# Patient Record
Sex: Female | Born: 1985 | Race: Black or African American | Hispanic: No | Marital: Single | Smoking: Current every day smoker
Health system: Southern US, Community
[De-identification: ages and names within clinical notes are randomized; demographics above are authoritative.]

## PROBLEM LIST (undated history)

## (undated) DIAGNOSIS — K219 Gastro-esophageal reflux disease without esophagitis: Secondary | ICD-10-CM

## (undated) DIAGNOSIS — F419 Anxiety disorder, unspecified: Secondary | ICD-10-CM

## (undated) DIAGNOSIS — G43909 Migraine, unspecified, not intractable, without status migrainosus: Secondary | ICD-10-CM

## (undated) DIAGNOSIS — F32A Depression, unspecified: Secondary | ICD-10-CM

## (undated) DIAGNOSIS — R011 Cardiac murmur, unspecified: Secondary | ICD-10-CM

## (undated) HISTORY — PX: MULTIPLE TOOTH EXTRACTIONS: SHX2053

## (undated) HISTORY — PX: DILATION AND CURETTAGE OF UTERUS: SHX78

---
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
## 2004-10-18 ENCOUNTER — Inpatient Hospital Stay (HOSPITAL_COMMUNITY): Admission: AD | Admit: 2004-10-18 | Discharge: 2004-10-21 | Payer: Self-pay | Admitting: Obstetrics and Gynecology

## 2004-11-19 ENCOUNTER — Other Ambulatory Visit: Admission: RE | Admit: 2004-11-19 | Discharge: 2004-11-19 | Payer: Self-pay | Admitting: Obstetrics and Gynecology

## 2006-07-15 ENCOUNTER — Encounter (INDEPENDENT_AMBULATORY_CARE_PROVIDER_SITE_OTHER): Payer: Self-pay | Admitting: *Deleted

## 2006-07-15 ENCOUNTER — Other Ambulatory Visit: Admission: RE | Admit: 2006-07-15 | Discharge: 2006-07-15 | Payer: Self-pay | Admitting: Unknown Physician Specialty

## 2009-05-11 ENCOUNTER — Emergency Department (HOSPITAL_COMMUNITY): Admission: EM | Admit: 2009-05-11 | Discharge: 2009-05-11 | Payer: Self-pay | Admitting: Emergency Medicine

## 2010-03-15 ENCOUNTER — Emergency Department (HOSPITAL_COMMUNITY): Admission: EM | Admit: 2010-03-15 | Discharge: 2010-03-15 | Payer: Self-pay | Admitting: Emergency Medicine

## 2010-04-07 ENCOUNTER — Emergency Department (HOSPITAL_COMMUNITY): Admission: EM | Admit: 2010-04-07 | Discharge: 2010-04-07 | Payer: Self-pay | Admitting: Emergency Medicine

## 2010-04-15 ENCOUNTER — Emergency Department (HOSPITAL_COMMUNITY): Admission: EM | Admit: 2010-04-15 | Discharge: 2010-04-16 | Payer: Self-pay | Admitting: Emergency Medicine

## 2010-06-29 ENCOUNTER — Emergency Department (HOSPITAL_BASED_OUTPATIENT_CLINIC_OR_DEPARTMENT_OTHER): Admission: EM | Admit: 2010-06-29 | Discharge: 2010-06-29 | Payer: Self-pay | Admitting: Emergency Medicine

## 2010-07-01 ENCOUNTER — Emergency Department (HOSPITAL_BASED_OUTPATIENT_CLINIC_OR_DEPARTMENT_OTHER): Admission: EM | Admit: 2010-07-01 | Discharge: 2010-07-01 | Payer: Self-pay | Admitting: Emergency Medicine

## 2010-07-05 ENCOUNTER — Emergency Department (HOSPITAL_BASED_OUTPATIENT_CLINIC_OR_DEPARTMENT_OTHER): Admission: EM | Admit: 2010-07-05 | Discharge: 2010-07-05 | Payer: Self-pay | Admitting: Emergency Medicine

## 2010-07-12 ENCOUNTER — Emergency Department (HOSPITAL_BASED_OUTPATIENT_CLINIC_OR_DEPARTMENT_OTHER): Admission: EM | Admit: 2010-07-12 | Discharge: 2010-07-12 | Payer: Self-pay | Admitting: Emergency Medicine

## 2010-11-23 LAB — URINALYSIS, ROUTINE W REFLEX MICROSCOPIC
Glucose, UA: NEGATIVE mg/dL
Leukocytes, UA: NEGATIVE
Nitrite: NEGATIVE
Protein, ur: NEGATIVE mg/dL
Urobilinogen, UA: 0.2 mg/dL (ref 0.0–1.0)

## 2010-11-23 LAB — URINE MICROSCOPIC-ADD ON

## 2010-11-23 LAB — PREGNANCY, URINE: Preg Test, Ur: NEGATIVE

## 2011-02-13 LAB — RPR: RPR: NONREACTIVE

## 2011-02-13 LAB — HEPATITIS B SURFACE ANTIGEN: Hepatitis B Surface Ag: NEGATIVE

## 2011-02-13 LAB — ABO/RH: RH Type: POSITIVE

## 2011-02-13 LAB — RUBELLA ANTIBODY, IGM: Rubella: IMMUNE

## 2011-02-14 ENCOUNTER — Other Ambulatory Visit (HOSPITAL_COMMUNITY): Payer: Self-pay | Admitting: Obstetrics and Gynecology

## 2011-02-14 DIAGNOSIS — Z3682 Encounter for antenatal screening for nuchal translucency: Secondary | ICD-10-CM

## 2011-02-20 ENCOUNTER — Ambulatory Visit (HOSPITAL_COMMUNITY): Payer: Self-pay

## 2011-02-21 ENCOUNTER — Ambulatory Visit (HOSPITAL_COMMUNITY)
Admission: RE | Admit: 2011-02-21 | Discharge: 2011-02-21 | Disposition: A | Discharge status: Home / Self Care | Payer: Medicaid Other | Source: Ambulatory Visit | Attending: Obstetrics and Gynecology | Admitting: Obstetrics and Gynecology

## 2011-02-21 ENCOUNTER — Other Ambulatory Visit (HOSPITAL_COMMUNITY): Payer: Self-pay | Admitting: Obstetrics and Gynecology

## 2011-02-21 DIAGNOSIS — E669 Obesity, unspecified: Secondary | ICD-10-CM | POA: Insufficient documentation

## 2011-02-21 DIAGNOSIS — O351XX0 Maternal care for (suspected) chromosomal abnormality in fetus, not applicable or unspecified: Secondary | ICD-10-CM | POA: Insufficient documentation

## 2011-02-21 DIAGNOSIS — Z09 Encounter for follow-up examination after completed treatment for conditions other than malignant neoplasm: Secondary | ICD-10-CM

## 2011-02-21 DIAGNOSIS — Z3689 Encounter for other specified antenatal screening: Secondary | ICD-10-CM | POA: Insufficient documentation

## 2011-02-21 DIAGNOSIS — Z3682 Encounter for antenatal screening for nuchal translucency: Secondary | ICD-10-CM

## 2011-02-21 DIAGNOSIS — O3510X0 Maternal care for (suspected) chromosomal abnormality in fetus, unspecified, not applicable or unspecified: Secondary | ICD-10-CM | POA: Insufficient documentation

## 2011-02-21 DIAGNOSIS — O99210 Obesity complicating pregnancy, unspecified trimester: Secondary | ICD-10-CM | POA: Insufficient documentation

## 2011-03-12 ENCOUNTER — Ambulatory Visit (HOSPITAL_COMMUNITY)
Admission: RE | Admit: 2011-03-12 | Discharge: 2011-03-12 | Disposition: A | Discharge status: Home / Self Care | Payer: Medicaid Other | Source: Ambulatory Visit | Attending: Obstetrics and Gynecology | Admitting: Obstetrics and Gynecology

## 2011-03-12 DIAGNOSIS — O351XX0 Maternal care for (suspected) chromosomal abnormality in fetus, not applicable or unspecified: Secondary | ICD-10-CM | POA: Insufficient documentation

## 2011-03-12 DIAGNOSIS — O3510X0 Maternal care for (suspected) chromosomal abnormality in fetus, unspecified, not applicable or unspecified: Secondary | ICD-10-CM | POA: Insufficient documentation

## 2011-04-04 ENCOUNTER — Other Ambulatory Visit (HOSPITAL_COMMUNITY): Payer: Self-pay | Admitting: Obstetrics and Gynecology

## 2011-04-04 ENCOUNTER — Ambulatory Visit (HOSPITAL_COMMUNITY)
Admission: RE | Admit: 2011-04-04 | Discharge: 2011-04-04 | Disposition: A | Discharge status: Home / Self Care | Payer: Medicaid Other | Source: Ambulatory Visit | Attending: Obstetrics and Gynecology | Admitting: Obstetrics and Gynecology

## 2011-04-04 ENCOUNTER — Encounter (HOSPITAL_COMMUNITY): Payer: Self-pay

## 2011-04-04 DIAGNOSIS — Z09 Encounter for follow-up examination after completed treatment for conditions other than malignant neoplasm: Secondary | ICD-10-CM

## 2011-04-04 DIAGNOSIS — E669 Obesity, unspecified: Secondary | ICD-10-CM | POA: Insufficient documentation

## 2011-04-04 DIAGNOSIS — O358XX0 Maternal care for other (suspected) fetal abnormality and damage, not applicable or unspecified: Secondary | ICD-10-CM | POA: Insufficient documentation

## 2011-04-04 DIAGNOSIS — Z1389 Encounter for screening for other disorder: Secondary | ICD-10-CM | POA: Insufficient documentation

## 2011-04-04 DIAGNOSIS — Z363 Encounter for antenatal screening for malformations: Secondary | ICD-10-CM | POA: Insufficient documentation

## 2011-05-02 ENCOUNTER — Ambulatory Visit (HOSPITAL_COMMUNITY)
Admission: RE | Admit: 2011-05-02 | Discharge: 2011-05-02 | Disposition: A | Discharge status: Home / Self Care | Payer: Medicaid Other | Source: Ambulatory Visit | Attending: Obstetrics and Gynecology | Admitting: Obstetrics and Gynecology

## 2011-05-02 DIAGNOSIS — Z3689 Encounter for other specified antenatal screening: Secondary | ICD-10-CM | POA: Insufficient documentation

## 2011-05-02 DIAGNOSIS — Z09 Encounter for follow-up examination after completed treatment for conditions other than malignant neoplasm: Secondary | ICD-10-CM

## 2011-05-02 DIAGNOSIS — E669 Obesity, unspecified: Secondary | ICD-10-CM | POA: Insufficient documentation

## 2011-08-07 LAB — STREP B DNA PROBE: GBS: NEGATIVE

## 2011-08-22 ENCOUNTER — Other Ambulatory Visit (HOSPITAL_COMMUNITY): Payer: Self-pay | Admitting: Obstetrics and Gynecology

## 2011-08-28 ENCOUNTER — Ambulatory Visit (HOSPITAL_COMMUNITY)
Admission: RE | Admit: 2011-08-28 | Discharge: 2011-08-28 | Disposition: A | Discharge status: Home / Self Care | Payer: Medicaid Other | Source: Ambulatory Visit | Attending: Obstetrics and Gynecology | Admitting: Obstetrics and Gynecology

## 2011-08-28 DIAGNOSIS — Z3689 Encounter for other specified antenatal screening: Secondary | ICD-10-CM | POA: Insufficient documentation

## 2011-08-28 DIAGNOSIS — E669 Obesity, unspecified: Secondary | ICD-10-CM | POA: Insufficient documentation

## 2011-08-29 ENCOUNTER — Ambulatory Visit (HOSPITAL_COMMUNITY): Payer: Medicaid Other

## 2011-08-30 ENCOUNTER — Inpatient Hospital Stay (HOSPITAL_COMMUNITY)
Admission: AD | Admit: 2011-08-30 | Discharge: 2011-08-30 | Disposition: A | Discharge status: Home / Self Care | Payer: Medicaid Other | Source: Ambulatory Visit | Attending: Obstetrics and Gynecology | Admitting: Obstetrics and Gynecology

## 2011-08-30 ENCOUNTER — Encounter (HOSPITAL_COMMUNITY): Payer: Self-pay | Admitting: *Deleted

## 2011-08-30 DIAGNOSIS — O479 False labor, unspecified: Secondary | ICD-10-CM | POA: Insufficient documentation

## 2011-08-30 HISTORY — DX: Cardiac murmur, unspecified: R01.1

## 2011-08-30 NOTE — Progress Notes (Signed)
Ginger ale to pt earlier

## 2011-08-30 NOTE — Progress Notes (Signed)
Written and verbal d/c instructions given and understanding voiced. 

## 2011-08-30 NOTE — Progress Notes (Signed)
efm d/ced and pt dressing for d/c home. Up to BR.

## 2011-08-30 NOTE — Progress Notes (Signed)
G2P1 at 39.5wks. Ctxs since Thurs but every since 1900. Lost mucous plug yest and cont to have thick mucousy d/c with some bloody streaks.

## 2011-08-30 NOTE — Progress Notes (Signed)
Dr Henderson Cloud notified of pt's admission and status. Pt may go home if not cervical change.

## 2011-08-31 ENCOUNTER — Inpatient Hospital Stay (HOSPITAL_COMMUNITY)
Admission: AD | Admit: 2011-08-31 | Discharge: 2011-09-03 | DRG: 766 | Disposition: A | Discharge status: Home / Self Care | Payer: Medicaid Other | Source: Ambulatory Visit | Attending: Obstetrics and Gynecology | Admitting: Obstetrics and Gynecology

## 2011-08-31 ENCOUNTER — Encounter (HOSPITAL_COMMUNITY): Payer: Self-pay | Admitting: Anesthesiology

## 2011-08-31 ENCOUNTER — Encounter (HOSPITAL_COMMUNITY)
Admission: AD | Disposition: A | Discharge status: Home / Self Care | Payer: Self-pay | Source: Ambulatory Visit | Attending: Obstetrics and Gynecology

## 2011-08-31 ENCOUNTER — Other Ambulatory Visit: Payer: Self-pay | Admitting: Obstetrics and Gynecology

## 2011-08-31 ENCOUNTER — Inpatient Hospital Stay (HOSPITAL_COMMUNITY): Payer: Medicaid Other | Admitting: Anesthesiology

## 2011-08-31 ENCOUNTER — Encounter (HOSPITAL_COMMUNITY): Payer: Self-pay | Admitting: Neonatology

## 2011-08-31 DIAGNOSIS — O99214 Obesity complicating childbirth: Secondary | ICD-10-CM | POA: Diagnosis present

## 2011-08-31 DIAGNOSIS — E669 Obesity, unspecified: Secondary | ICD-10-CM | POA: Diagnosis present

## 2011-08-31 DIAGNOSIS — Z98891 History of uterine scar from previous surgery: Secondary | ICD-10-CM

## 2011-08-31 LAB — CBC
HCT: 31.4 % — ABNORMAL LOW (ref 36.0–46.0)
Hemoglobin: 10.4 g/dL — ABNORMAL LOW (ref 12.0–15.0)
MCV: 84.6 fL (ref 78.0–100.0)
RBC: 3.71 MIL/uL — ABNORMAL LOW (ref 3.87–5.11)
WBC: 9.9 10*3/uL (ref 4.0–10.5)

## 2011-08-31 SURGERY — Surgical Case
Anesthesia: Epidural | Site: Abdomen | Wound class: Clean Contaminated

## 2011-08-31 MED ORDER — OXYTOCIN 20 UNITS IN LACTATED RINGERS INFUSION - SIMPLE
1.0000 m[IU]/min | INTRAVENOUS | Status: DC
  Administered 2011-08-31 (×2): 2 m[IU]/min via INTRAVENOUS

## 2011-08-31 MED ORDER — ONDANSETRON HCL 4 MG/2ML IJ SOLN: 4.0000 mg | Freq: Four times a day (QID) | INTRAMUSCULAR | Status: DC | PRN

## 2011-08-31 MED ORDER — DIBUCAINE 1 % RE OINT: 1.0000 "application " | TOPICAL_OINTMENT | RECTAL | Status: DC | PRN

## 2011-08-31 MED ORDER — EPHEDRINE 5 MG/ML INJ
10.0000 mg | INTRAVENOUS | Status: DC | PRN
  Filled 2011-08-31: qty 4

## 2011-08-31 MED ORDER — LACTATED RINGERS IV SOLN
INTRAVENOUS | Status: DC | PRN
  Administered 2011-08-31 (×3): via INTRAVENOUS

## 2011-08-31 MED ORDER — FENTANYL 2.5 MCG/ML BUPIVACAINE 1/10 % EPIDURAL INFUSION (WH - ANES)
INTRAMUSCULAR | Status: DC | PRN
  Administered 2011-08-31: 13 mL/h via EPIDURAL

## 2011-08-31 MED ORDER — CEFAZOLIN SODIUM 1-5 GM-% IV SOLN
INTRAVENOUS | Status: DC | PRN
  Administered 2011-08-31: 1 g via INTRAVENOUS

## 2011-08-31 MED ORDER — OXYCODONE-ACETAMINOPHEN 5-325 MG PO TABS
1.0000 | ORAL_TABLET | ORAL | Status: DC | PRN
  Administered 2011-09-01: 1 via ORAL
  Administered 2011-09-01: 2 via ORAL
  Administered 2011-09-02 – 2011-09-03 (×3): 1 via ORAL
  Filled 2011-08-31 (×4): qty 1
  Filled 2011-08-31: qty 2

## 2011-08-31 MED ORDER — TERBUTALINE SULFATE 1 MG/ML IJ SOLN
INTRAMUSCULAR | Status: AC
  Administered 2011-08-31: 0.25 mg
  Filled 2011-08-31: qty 1

## 2011-08-31 MED ORDER — PHENYLEPHRINE 40 MCG/ML (10ML) SYRINGE FOR IV PUSH (FOR BLOOD PRESSURE SUPPORT)
80.0000 ug | PREFILLED_SYRINGE | INTRAVENOUS | Status: DC | PRN
  Filled 2011-08-31: qty 5

## 2011-08-31 MED ORDER — OXYCODONE-ACETAMINOPHEN 5-325 MG PO TABS: 2.0000 | ORAL_TABLET | ORAL | Status: DC | PRN

## 2011-08-31 MED ORDER — FENTANYL CITRATE 0.05 MG/ML IJ SOLN
INTRAMUSCULAR | Status: AC
  Filled 2011-08-31: qty 5

## 2011-08-31 MED ORDER — CITRIC ACID-SODIUM CITRATE 334-500 MG/5ML PO SOLN
30.0000 mL | ORAL | Status: DC | PRN
  Administered 2011-08-31: 30 mL via ORAL
  Filled 2011-08-31: qty 15

## 2011-08-31 MED ORDER — FENTANYL 2.5 MCG/ML BUPIVACAINE 1/10 % EPIDURAL INFUSION (WH - ANES)
14.0000 mL/h | INTRAMUSCULAR | Status: DC
  Administered 2011-08-31: 14 mL/h via EPIDURAL
  Filled 2011-08-31 (×2): qty 60

## 2011-08-31 MED ORDER — LACTATED RINGERS IV SOLN
500.0000 mL | INTRAVENOUS | Status: DC | PRN
Start: 2011-08-31 — End: 2011-08-31
  Administered 2011-08-31 (×2): 500 mL via INTRAVENOUS

## 2011-08-31 MED ORDER — SODIUM CHLORIDE 0.9 % IJ SOLN: 3.0000 mL | INTRAMUSCULAR | Status: DC | PRN

## 2011-08-31 MED ORDER — EPHEDRINE 5 MG/ML INJ: 10.0000 mg | INTRAVENOUS | Status: DC | PRN

## 2011-08-31 MED ORDER — FENTANYL CITRATE 0.05 MG/ML IJ SOLN: 25.0000 ug | INTRAMUSCULAR | Status: DC | PRN

## 2011-08-31 MED ORDER — NALBUPHINE HCL 10 MG/ML IJ SOLN
5.0000 mg | INTRAMUSCULAR | Status: DC | PRN
  Filled 2011-08-31: qty 1

## 2011-08-31 MED ORDER — LIDOCAINE HCL 1.5 % IJ SOLN
INTRAMUSCULAR | Status: DC | PRN
  Administered 2011-08-31 (×2): 4 mL via EPIDURAL

## 2011-08-31 MED ORDER — CEFAZOLIN SODIUM 1-5 GM-% IV SOLN
INTRAVENOUS | Status: AC
  Filled 2011-08-31: qty 50

## 2011-08-31 MED ORDER — DIPHENHYDRAMINE HCL 50 MG/ML IJ SOLN: 12.5000 mg | INTRAMUSCULAR | Status: DC | PRN

## 2011-08-31 MED ORDER — OXYTOCIN 10 UNIT/ML IJ SOLN
INTRAMUSCULAR | Status: AC
  Filled 2011-08-31: qty 4

## 2011-08-31 MED ORDER — ONDANSETRON HCL 4 MG/2ML IJ SOLN
4.0000 mg | Freq: Three times a day (TID) | INTRAMUSCULAR | Status: DC | PRN
  Filled 2011-08-31: qty 2

## 2011-08-31 MED ORDER — OXYTOCIN 20 UNITS IN LACTATED RINGERS INFUSION - SIMPLE: 125.0000 mL/h | INTRAVENOUS | Status: AC

## 2011-08-31 MED ORDER — MORPHINE SULFATE (PF) 0.5 MG/ML IJ SOLN
INTRAMUSCULAR | Status: DC | PRN
  Administered 2011-08-31: 4 mg via EPIDURAL

## 2011-08-31 MED ORDER — SODIUM CHLORIDE 0.9 % IR SOLN
Status: DC | PRN
  Administered 2011-08-31: 1

## 2011-08-31 MED ORDER — METOCLOPRAMIDE HCL 5 MG/ML IJ SOLN: 10.0000 mg | Freq: Three times a day (TID) | INTRAMUSCULAR | Status: DC | PRN

## 2011-08-31 MED ORDER — TETANUS-DIPHTH-ACELL PERTUSSIS 5-2.5-18.5 LF-MCG/0.5 IM SUSP: 0.5000 mL | Freq: Once | INTRAMUSCULAR | Status: DC

## 2011-08-31 MED ORDER — SCOPOLAMINE 1 MG/3DAYS TD PT72
1.0000 | MEDICATED_PATCH | Freq: Once | TRANSDERMAL | Status: DC
  Filled 2011-08-31: qty 1

## 2011-08-31 MED ORDER — SODIUM CHLORIDE 0.9 % IV SOLN
1.0000 ug/kg/h | INTRAVENOUS | Status: DC | PRN
  Filled 2011-08-31: qty 2.5

## 2011-08-31 MED ORDER — MEPERIDINE HCL 25 MG/ML IJ SOLN: 6.2500 mg | INTRAMUSCULAR | Status: DC | PRN

## 2011-08-31 MED ORDER — LACTATED RINGERS IV SOLN
INTRAVENOUS | Status: DC | PRN
  Administered 2011-08-31: 20:00:00 via INTRAVENOUS

## 2011-08-31 MED ORDER — WITCH HAZEL-GLYCERIN EX PADS: 1.0000 "application " | MEDICATED_PAD | CUTANEOUS | Status: DC | PRN

## 2011-08-31 MED ORDER — FLEET ENEMA 7-19 GM/118ML RE ENEM: 1.0000 | ENEMA | RECTAL | Status: DC | PRN

## 2011-08-31 MED ORDER — SODIUM BICARBONATE 8.4 % IV SOLN
INTRAVENOUS | Status: AC
  Filled 2011-08-31: qty 50

## 2011-08-31 MED ORDER — OXYTOCIN 20 UNITS IN LACTATED RINGERS INFUSION - SIMPLE
INTRAVENOUS | Status: DC | PRN
  Administered 2011-08-31 (×2): 20 [IU] via INTRAVENOUS

## 2011-08-31 MED ORDER — MORPHINE SULFATE 0.5 MG/ML IJ SOLN
INTRAMUSCULAR | Status: AC
  Filled 2011-08-31: qty 10

## 2011-08-31 MED ORDER — PRENATAL MULTIVITAMIN CH
1.0000 | ORAL_TABLET | Freq: Every day | ORAL | Status: DC
  Administered 2011-09-02: 1 via ORAL
  Filled 2011-08-31: qty 1

## 2011-08-31 MED ORDER — SODIUM BICARBONATE 8.4 % IV SOLN
INTRAVENOUS | Status: DC | PRN
  Administered 2011-08-31: 4 mL via EPIDURAL

## 2011-08-31 MED ORDER — LACTATED RINGERS IV SOLN
INTRAVENOUS | Status: DC
  Administered 2011-09-01: 04:00:00 via INTRAVENOUS

## 2011-08-31 MED ORDER — DIPHENHYDRAMINE HCL 25 MG PO CAPS
25.0000 mg | ORAL_CAPSULE | ORAL | Status: DC | PRN
  Administered 2011-09-01: 25 mg via ORAL
  Filled 2011-08-31: qty 1

## 2011-08-31 MED ORDER — TERBUTALINE SULFATE 1 MG/ML IJ SOLN
0.2500 mg | Freq: Once | INTRAMUSCULAR | Status: AC | PRN
  Administered 2011-08-31: 0.25 mg via SUBCUTANEOUS

## 2011-08-31 MED ORDER — ACETAMINOPHEN 325 MG PO TABS
650.0000 mg | ORAL_TABLET | ORAL | Status: DC | PRN
  Administered 2011-08-31: 650 mg via ORAL
  Filled 2011-08-31: qty 1

## 2011-08-31 MED ORDER — LIDOCAINE HCL (PF) 1 % IJ SOLN
30.0000 mL | INTRAMUSCULAR | Status: DC | PRN
  Filled 2011-08-31 (×2): qty 30

## 2011-08-31 MED ORDER — LIDOCAINE-EPINEPHRINE (PF) 2 %-1:200000 IJ SOLN
INTRAMUSCULAR | Status: AC
  Filled 2011-08-31: qty 20

## 2011-08-31 MED ORDER — IBUPROFEN 600 MG PO TABS
600.0000 mg | ORAL_TABLET | Freq: Four times a day (QID) | ORAL | Status: DC
  Administered 2011-09-01 – 2011-09-03 (×9): 600 mg via ORAL
  Filled 2011-08-31 (×2): qty 1

## 2011-08-31 MED ORDER — OXYTOCIN 20 UNITS IN LACTATED RINGERS INFUSION - SIMPLE: 125.0000 mL/h | Freq: Once | INTRAVENOUS | Status: DC

## 2011-08-31 MED ORDER — PRENATAL MULTIVITAMIN CH
1.0000 | ORAL_TABLET | Freq: Every day | ORAL | Status: DC
  Administered 2011-09-01: 1 via ORAL
  Filled 2011-08-31: qty 1

## 2011-08-31 MED ORDER — METOCLOPRAMIDE HCL 5 MG/ML IJ SOLN: 10.0000 mg | Freq: Once | INTRAMUSCULAR | Status: DC | PRN

## 2011-08-31 MED ORDER — PHENYLEPHRINE 40 MCG/ML (10ML) SYRINGE FOR IV PUSH (FOR BLOOD PRESSURE SUPPORT): 80.0000 ug | PREFILLED_SYRINGE | INTRAVENOUS | Status: DC | PRN

## 2011-08-31 MED ORDER — ONDANSETRON HCL 4 MG/2ML IJ SOLN
INTRAMUSCULAR | Status: AC
  Filled 2011-08-31: qty 2

## 2011-08-31 MED ORDER — KETOROLAC TROMETHAMINE 30 MG/ML IJ SOLN
INTRAMUSCULAR | Status: AC
  Filled 2011-08-31: qty 1

## 2011-08-31 MED ORDER — ONDANSETRON HCL 4 MG/2ML IJ SOLN
4.0000 mg | INTRAMUSCULAR | Status: DC | PRN
  Administered 2011-08-31: 4 mg via INTRAVENOUS

## 2011-08-31 MED ORDER — LACTATED RINGERS IV SOLN
INTRAVENOUS | Status: DC
  Administered 2011-08-31 (×2): via INTRAVENOUS

## 2011-08-31 MED ORDER — OXYTOCIN BOLUS FROM INFUSION
500.0000 mL | Freq: Once | INTRAVENOUS | Status: DC
  Filled 2011-08-31: qty 1000
  Filled 2011-08-31: qty 500
  Filled 2011-08-31: qty 1000

## 2011-08-31 MED ORDER — LACTATED RINGERS IV SOLN
500.0000 mL | Freq: Once | INTRAVENOUS | Status: AC
  Administered 2011-08-31: 1000 mL via INTRAVENOUS

## 2011-08-31 MED ORDER — NALOXONE HCL 0.4 MG/ML IJ SOLN: 0.4000 mg | INTRAMUSCULAR | Status: DC | PRN

## 2011-08-31 MED ORDER — IBUPROFEN 600 MG PO TABS
600.0000 mg | ORAL_TABLET | Freq: Four times a day (QID) | ORAL | Status: DC | PRN
  Filled 2011-08-31 (×7): qty 1

## 2011-08-31 MED ORDER — FENTANYL CITRATE 0.05 MG/ML IJ SOLN
INTRAMUSCULAR | Status: DC | PRN
  Administered 2011-08-31 (×3): 50 ug via INTRAVENOUS
  Administered 2011-08-31: 100 ug via INTRAVENOUS

## 2011-08-31 MED ORDER — ZOLPIDEM TARTRATE 5 MG PO TABS: 5.0000 mg | ORAL_TABLET | Freq: Every evening | ORAL | Status: DC | PRN

## 2011-08-31 MED ORDER — MEASLES, MUMPS & RUBELLA VAC ~~LOC~~ INJ
0.5000 mL | INJECTION | Freq: Once | SUBCUTANEOUS | Status: DC
  Filled 2011-08-31: qty 0.5

## 2011-08-31 MED ORDER — KETOROLAC TROMETHAMINE 30 MG/ML IJ SOLN: 30.0000 mg | Freq: Four times a day (QID) | INTRAMUSCULAR | Status: AC | PRN

## 2011-08-31 MED ORDER — MORPHINE SULFATE (PF) 0.5 MG/ML IJ SOLN
INTRAMUSCULAR | Status: DC | PRN
  Administered 2011-08-31: 1 mg via INTRAVENOUS

## 2011-08-31 MED ORDER — ONDANSETRON HCL 4 MG/2ML IJ SOLN
INTRAMUSCULAR | Status: DC | PRN
  Administered 2011-08-31: 4 mg via INTRAVENOUS

## 2011-08-31 MED ORDER — LACTATED RINGERS IV SOLN
INTRAVENOUS | Status: DC
  Administered 2011-08-31: 300 mL via INTRAUTERINE

## 2011-08-31 MED ORDER — ONDANSETRON HCL 4 MG PO TABS: 4.0000 mg | ORAL_TABLET | ORAL | Status: DC | PRN

## 2011-08-31 MED ORDER — IBUPROFEN 600 MG PO TABS: 600.0000 mg | ORAL_TABLET | Freq: Four times a day (QID) | ORAL | Status: DC | PRN

## 2011-08-31 MED ORDER — KETOROLAC TROMETHAMINE 30 MG/ML IJ SOLN
30.0000 mg | Freq: Four times a day (QID) | INTRAMUSCULAR | Status: AC | PRN
  Administered 2011-08-31: 30 mg via INTRAMUSCULAR

## 2011-08-31 MED ORDER — DIPHENHYDRAMINE HCL 50 MG/ML IJ SOLN: 25.0000 mg | INTRAMUSCULAR | Status: DC | PRN

## 2011-08-31 SURGICAL SUPPLY — 31 items
CLOTH BEACON ORANGE TIMEOUT ST (SAFETY) ×2 IMPLANT
CONTAINER PREFILL 10% NBF 15ML (MISCELLANEOUS) IMPLANT
DRESSING TELFA 8X3 (GAUZE/BANDAGES/DRESSINGS) ×2 IMPLANT
DRSG PAD ABDOMINAL 8X10 ST (GAUZE/BANDAGES/DRESSINGS) ×2 IMPLANT
ELECT REM PT RETURN 9FT ADLT (ELECTROSURGICAL) ×2
ELECTRODE REM PT RTRN 9FT ADLT (ELECTROSURGICAL) ×1 IMPLANT
EXTRACTOR VACUUM M CUP 4 TUBE (SUCTIONS) IMPLANT
GAUZE SPONGE 4X4 12PLY STRL LF (GAUZE/BANDAGES/DRESSINGS) ×2 IMPLANT
GLOVE ECLIPSE 7.0 STRL STRAW (GLOVE) ×4 IMPLANT
GOWN PREVENTION PLUS LG XLONG (DISPOSABLE) ×2 IMPLANT
GOWN PREVENTION PLUS XLARGE (GOWN DISPOSABLE) ×2 IMPLANT
KIT ABG SYR 3ML LUER SLIP (SYRINGE) IMPLANT
NEEDLE HYPO 25X5/8 SAFETYGLIDE (NEEDLE) IMPLANT
NS IRRIG 1000ML POUR BTL (IV SOLUTION) ×2 IMPLANT
PACK C SECTION WH (CUSTOM PROCEDURE TRAY) ×2 IMPLANT
RETRACTOR WND ALEXIS 25 LRG (MISCELLANEOUS) ×1 IMPLANT
RETRACTOR WOUND ALXS 34CM XLRG (MISCELLANEOUS) IMPLANT
RTRCTR WOUND ALEXIS 25CM LRG (MISCELLANEOUS) ×2
RTRCTR WOUND ALEXIS 34CM XLRG (MISCELLANEOUS)
SLEEVE SCD COMPRESS KNEE MED (MISCELLANEOUS) ×2 IMPLANT
SPONGE LAP 18X18 X RAY DECT (DISPOSABLE) ×2 IMPLANT
STAPLER VISISTAT 35W (STAPLE) ×2 IMPLANT
SUT MNCRL 0 VIOLET CTX 36 (SUTURE) ×5 IMPLANT
SUT MON AB 2-0 CT1 27 (SUTURE) ×4 IMPLANT
SUT MONOCRYL 0 CTX 36 (SUTURE) ×5
SUT PLAIN 0 NONE (SUTURE) IMPLANT
SUT PLAIN 2 0 XLH (SUTURE) ×2 IMPLANT
TAPE CLOTH SURG 4X10 WHT LF (GAUZE/BANDAGES/DRESSINGS) ×2 IMPLANT
TOWEL OR 17X24 6PK STRL BLUE (TOWEL DISPOSABLE) ×4 IMPLANT
TRAY FOLEY CATH 14FR (SET/KITS/TRAYS/PACK) IMPLANT
WATER STERILE IRR 1000ML POUR (IV SOLUTION) ×2 IMPLANT

## 2011-08-31 NOTE — Progress Notes (Signed)
Dr. Dareen Piano updated on pt status, new orders received

## 2011-08-31 NOTE — Anesthesia Preprocedure Evaluation (Signed)
Anesthesia Evaluation  Patient identified by MRN, date of birth, ID band Patient awake    Reviewed: Allergy & Precautions, H&P , Patient's Chart, lab work & pertinent test results  Airway Mallampati: III TM Distance: >3 FB Neck ROM: full    Dental No notable dental hx. (+) Teeth Intact   Pulmonary neg pulmonary ROS,  clear to auscultation  Pulmonary exam normal       Cardiovascular neg cardio ROS + Valvular Problems/Murmurs regular Normal+ Systolic murmurs    Neuro/Psych Negative Neurological ROS  Negative Psych ROS   GI/Hepatic negative GI ROS, Neg liver ROS,   Endo/Other  Negative Endocrine ROSMorbid obesity  Renal/GU negative Renal ROS  Genitourinary negative   Musculoskeletal   Abdominal Normal abdominal exam  (+)   Peds  Hematology negative hematology ROS (+)   Anesthesia Other Findings   Reproductive/Obstetrics (+) Pregnancy                           Anesthesia Physical Anesthesia Plan  ASA: III  Anesthesia Plan: Epidural   Post-op Pain Management:    Induction:   Airway Management Planned:   Additional Equipment:   Intra-op Plan:   Post-operative Plan:   Informed Consent: I have reviewed the patients History and Physical, chart, labs and discussed the procedure including the risks, benefits and alternatives for the proposed anesthesia with the patient or authorized representative who has indicated his/her understanding and acceptance.     Plan Discussed with: Anesthesiologist and Surgeon  Anesthesia Plan Comments:         Anesthesia Quick Evaluation

## 2011-08-31 NOTE — Consult Note (Signed)
Called to attend primary C/S for fetal intolerance to labor. Received terbutaline in the interim to prepare for C/S. No other risk factors reported.   At delivery fluid was clear and infant was delivered manually from a vertex presentation with good tone and spontaneous cry following delivery. Nuchal cord x 1 (loose) reported by Dr. Dareen Piano.   Infant given tactile stim with drying and bulb suction to naso/oropharynx.  No dysmorphic features are noted. Clavicles are intact after observation of  tight extraction.[place] to parents and then allowed to remain in OR under responsibility of L/D RN.    Care to assigned pediatrician.     Dagoberto Ligas MD [facility]

## 2011-08-31 NOTE — Progress Notes (Signed)
New order per MD to administer 0.25 mg of Terbutaline SQ now

## 2011-08-31 NOTE — Progress Notes (Signed)
+   fetal scalp stim

## 2011-08-31 NOTE — Progress Notes (Signed)
Pitocin off

## 2011-08-31 NOTE — H&P (Signed)
Pt is a 25 year old black female, G3P1011 at term who presents to [facility] hospital in labor. PNC was complicated by morbid obesity. Pt had an ultrasound 3-4 days ago which gave an EFW of 8 pounds.   PMHx: see [facility] PE: morbidly obese black female in NAD        HEENT- wnl        Abd-gravid, palp contractions        Cx- on admission pt was 50/4/-2 vtx        FHTs- reactive  IMP/ IUP at term, labor Plan/ admit

## 2011-08-31 NOTE — Progress Notes (Addendum)
Dr. Dareen Piano updated on patient status, fetal strip, and cervical exam. No new orders at this time.

## 2011-08-31 NOTE — Progress Notes (Addendum)
Dr. Dareen Piano updated on pt status, new orders received. To start low dose pitocin if needed. Ok to start at 2 mu and go up by 2 mu

## 2011-08-31 NOTE — Progress Notes (Signed)
Dosing Epidural

## 2011-08-31 NOTE — OR Nursing (Signed)
Fundal massage DLWegner RN 

## 2011-08-31 NOTE — Anesthesia Postprocedure Evaluation (Signed)
  Anesthesia Post-op Note  Patient: Lauren Green  Procedure(s) Performed:  CESAREAN SECTION - primary cesarean section of baby boy  at 75  APGAR 9/9  Patient Location: PACU  Anesthesia Type: Epidural  Level of Consciousness: awake, alert  and oriented  Airway and Oxygen Therapy: Patient Spontanous Breathing  Post-op Pain: none  Post-op Assessment: Post-op Vital signs reviewed, Patient's Cardiovascular Status Stable, Respiratory Function Stable, Patent Airway, No signs of Nausea or vomiting, Pain level controlled, No headache and No backache  Post-op Vital Signs: Reviewed and stable  Complications: No apparent anesthesia complications

## 2011-08-31 NOTE — Progress Notes (Addendum)
p here last night for contrations and sent home. Reprots conractions are stronger now and closer together.Pt reprots some blood streaked mucusy discharge and reports good fetal movement.

## 2011-08-31 NOTE — Progress Notes (Signed)
Positive Fetal scalp stim Cervical swelling noted on right side, to notify MD

## 2011-08-31 NOTE — Progress Notes (Signed)
Dr. Dareen Piano updated on patient status, new orders received

## 2011-08-31 NOTE — Anesthesia Procedure Notes (Signed)
Epidural Patient location during procedure: OB Start time: 08/31/2011 12:18 PM  Staffing Anesthesiologist: Jadin Creque A. Performed by: anesthesiologist   Preanesthetic Checklist Completed: patient identified, site marked, surgical consent, pre-op evaluation, timeout performed, IV checked, risks and benefits discussed and monitors and equipment checked  Epidural Patient position: sitting Prep: site prepped and draped and DuraPrep Patient monitoring: continuous pulse ox and blood pressure Approach: midline Injection technique: LOR air  Needle:  Needle type: Tuohy  Needle gauge: 17 G Needle length: 9 cm Needle insertion depth: 8 cm Catheter type: closed end flexible Catheter size: 19 Gauge Catheter at skin depth: 14 cm Test dose: negative and 1.5% lidocaine  Assessment Events: blood not aspirated, injection not painful, no injection resistance, negative IV test and no paresthesia  Additional Notes Patient is more comfortable after epidural dosed. Please see RN's note for documentation of vital signs and FHR which are stable.

## 2011-08-31 NOTE — Progress Notes (Signed)
MD aware

## 2011-08-31 NOTE — Progress Notes (Signed)
Dr Dareen Piano called concerning FHR.  Informed him it has slowly come up to 120.

## 2011-08-31 NOTE — Progress Notes (Signed)
Dr. Dareen Piano updated on patient status including fetal strip and cervical exam. To come assess patient

## 2011-08-31 NOTE — Transfer of Care (Signed)
Immediate Anesthesia Transfer of Care Note  Patient: Lauren Green  Procedure(s) Performed:  CESAREAN SECTION - primary cesarean section of baby boy  at 70  APGAR 9/9  Patient Location: PACU  Anesthesia Type: Epidural  Level of Consciousness: awake, alert , oriented and patient cooperative  Airway & Oxygen Therapy: Patient Spontanous Breathing  Post-op Assessment: Report given to PACU RN and Post -op Vital signs reviewed and stable  Post vital signs: Reviewed and stable  Complications: No apparent anesthesia complications

## 2011-08-31 NOTE — Progress Notes (Signed)
Walked in patient room and patient vomiting and sitting in high fowler position. Maternal heart rate noted on fetal strip. Cardio adjusted

## 2011-08-31 NOTE — Progress Notes (Signed)
This note also relates to the following rows which could not be included: BP - Cannot attach notes to rows marked as read only Pulse Rate - Cannot attach notes to rows marked as read only SpO2 - Cannot attach notes to rows marked as read only Urethral Catherter inserted while patient in right lateral due to fetal strip deccelerations

## 2011-08-31 NOTE — Progress Notes (Signed)
Report rcvd from [facility], RN

## 2011-09-01 LAB — CBC
HCT: 25.2 % — ABNORMAL LOW (ref 36.0–46.0)
Hemoglobin: 8.4 g/dL — ABNORMAL LOW (ref 12.0–15.0)
MCH: 28.5 pg (ref 26.0–34.0)
MCHC: 33.3 g/dL (ref 30.0–36.0)
RDW: 13.7 % (ref 11.5–15.5)

## 2011-09-01 MED ORDER — FERROUS SULFATE 325 (65 FE) MG PO TABS
325.0000 mg | ORAL_TABLET | Freq: Two times a day (BID) | ORAL | Status: DC
  Administered 2011-09-01 – 2011-09-02 (×4): 325 mg via ORAL
  Filled 2011-09-01 (×4): qty 1

## 2011-09-01 NOTE — Progress Notes (Signed)
PPD#1 Pt without c/o. Lochia-mild. VSSAF IMP/ Stable Plan/ routine care.

## 2011-09-01 NOTE — Op Note (Signed)
[name], Lauren Green             ACCOUNT NO.:  [id number]  MEDICAL RECORD NO.:  [id number]  LOCATION:  9104                          FACILITY:  [facility]  PHYSICIAN:  Malva Limes, M.D.    DATE OF BIRTH:  1985-12-13  DATE OF PROCEDURE:  08/31/2011 DATE OF DISCHARGE:                              OPERATIVE REPORT   PREOPERATIVE DIAGNOSES: 1. Intrauterine pregnancy at term. 2. Fetal intolerance to labor.  POSTOPERATIVE DIAGNOSES: 1. Intrauterine pregnancy at term. 2. Fetal intolerance to labor, with nuchal cord.  PROCEDURE:  Primary low transverse cesarean section.  SURGEON:  Malva Limes, MD  ANESTHESIA:  Epidural.  ANTIBIOTICS:  Ancef 1 g.  DRAINS:  Foley bedside drainage.  ESTIMATED BLOOD LOSS:  1000 mL.  COMPLICATIONS:  None.  SPECIMENS:  None.  PROCEDURE:  The patient was taken to the operating room where she was placed in dorsal supine position with left lateral tilt.  Once an adequate level was reached, the patient was prepped and draped in the usual fashion for this procedure.  A Pfannenstiel incision was made 2 cm above the pubic symphysis.  On entering the abdominal cavity, the bladder flap was taken down with sharp dissection.  A low-transverse uterine incision was made in the midline and extended laterally with blunt dissection.  The infant was then delivered in a vertex presentation.  On delivery of the head, the oropharynx and nostrils were bulb suctioned.  The remaining infant was delivered.  The cord was doubly clamped and cut, and the infant was handed to the awaiting NICU team.  Cord blood was obtained.  The placenta was manually removed.  The uterine incision was closed in a single layer of 0 Monocryl suture in a running, locking fashion.  The bladder flap was not closed.  Hemostasis was checked and felt to be adequate.  The parietal peritoneum and rectus muscles were approximated in the midline using 2-0 Monocryl in a running fashion.  The fascia was  closed using 0 Monocryl suture in a running fashion.  Subcuticular tissue was made hemostatic with the Bovie.  The subcuticular tissue was then closed using interrupted 2- 0 plain gut suture.  Stainless steel clips were used to close the skin. Instrument and lap counts were correct x3.  The patient tolerated the procedure well.  She was taken to the recovery room in stable condition. Instrument and lap counts were correct x3.          ______________________________ Malva Limes, M.D.     MA/MEDQ  D:  08/31/2011  T:  09/01/2011  Job:  [id number]

## 2011-09-01 NOTE — Anesthesia Postprocedure Evaluation (Signed)
  Anesthesia Post-op Note  Patient: Lauren Green  Procedure(s) Performed:  CESAREAN SECTION - primary cesarean section of baby boy  at 67  APGAR 9/9  Patient Location: Mother/Baby  Anesthesia Type: Epidural  Level of Consciousness: awake, alert  and oriented  Airway and Oxygen Therapy: Patient Spontanous Breathing  Post-op Assessment: Patient's Cardiovascular Status Stable and Respiratory Function Stable  Post-op Vital Signs: Reviewed and stable  Complications: No apparent anesthesia complications

## 2011-09-01 NOTE — Addendum Note (Signed)
Addendum  created 09/01/11 0915 by Edison Pace, CRNA   Modules edited:Notes Section

## 2011-09-02 NOTE — Progress Notes (Signed)
POD#2 Pt without c/o. States that she does not want to go home today. Worried about baby after she knocked baby off of bed and onto floor.  Lochia-wnl. Imp/Doing well. Plan/ Routine care

## 2011-09-02 NOTE — Progress Notes (Signed)
UR chart review completed.  

## 2011-09-03 MED ORDER — OXYCODONE-ACETAMINOPHEN 5-325 MG PO TABS: 1.0000 | ORAL_TABLET | ORAL | Status: AC | PRN

## 2011-09-03 NOTE — Progress Notes (Signed)
Post Op Day 3 Subjective: no complaints, up ad lib, voiding, tolerating PO and + flatus  Objective: Blood pressure 126/85, pulse 87, temperature 98.3 F (36.8 C), temperature source Oral, resp. rate 18, height 5\' 2"  (1.575 m), weight 128.459 kg (283 lb 3.2 oz), last menstrual period 11/25/2010, SpO2 100.00%, unknown if currently breastfeeding.  Physical Exam:  General: alert Lochia: appropriate Uterine Fundus: firm Incision: healing well   Basename 09/01/11 0521 08/31/11 1115  HGB 8.4* 10.4*  HCT 25.2* 31.4*    Assessment/Plan: Discharge home and Breastfeeding   LOS: 3 days   Yurani Fettes D 09/03/2011, 10:23 AM

## 2011-09-03 NOTE — Discharge Summary (Signed)
Obstetric Discharge Summary Reason for Admission: onset of labor Prenatal Procedures: ultrasound Intrapartum Procedures: cesarean: low cervical, transverse Postpartum Procedures: none Complications-Operative and Postpartum: Non reassuring fetal heart rate tracing Hemoglobin  Date Value Range Status  09/01/2011 8.4* 12.0-15.0 (g/dL) Final     DELTA CHECK NOTED     REPEATED TO VERIFY     HCT  Date Value Range Status  09/01/2011 25.2* 36.0-46.0 (%) Final    Discharge Diagnoses: Term Pregnancy-delivered, Abnormal fetal heart rate tracing  Discharge Information: Date: 09/03/2011 Activity: Limited Diet: routine Medications: PNV, Ibuprofen and Percocet Condition: stable Instructions: refer to practice specific booklet Discharge to: home Follow-up Information    Follow up with ANDERSON,MARK E in 4 weeks.   Contact information:   [address] [place] [address] [phone]          Newborn Data: Live born female  Birth Weight: 8 lb 1.1 oz (3660 g) APGAR: 9, 9  Home with mother.  Kenna Seward D 09/03/2011, 10:35 AM

## 2011-09-05 ENCOUNTER — Encounter (HOSPITAL_COMMUNITY): Payer: Self-pay | Admitting: Obstetrics and Gynecology

## 2013-03-30 IMAGING — US US OB NUCHAL TRANSLUCENCY 1ST GEST
1 series · 14 of 28 positions shown · non-contrast
Comparison: none

[Series 1: us ob nuchal translucency 1st gest · 0.23mm/px · 14 of 49 slices shown]
[im 2/49]
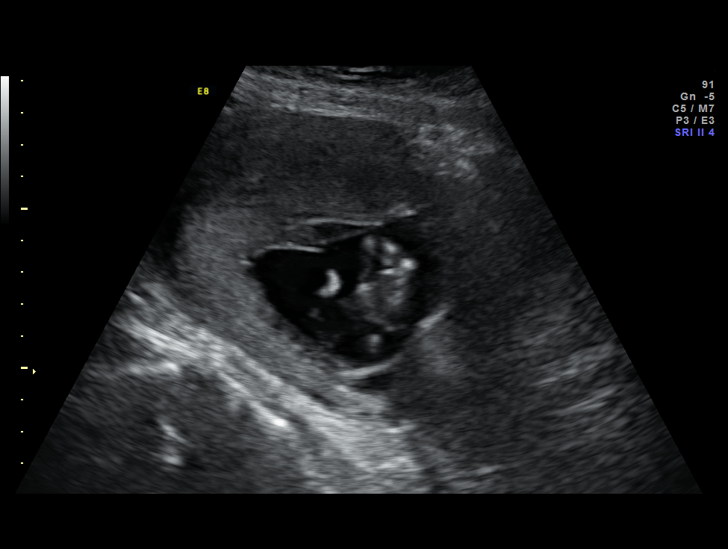
[im 6/49]
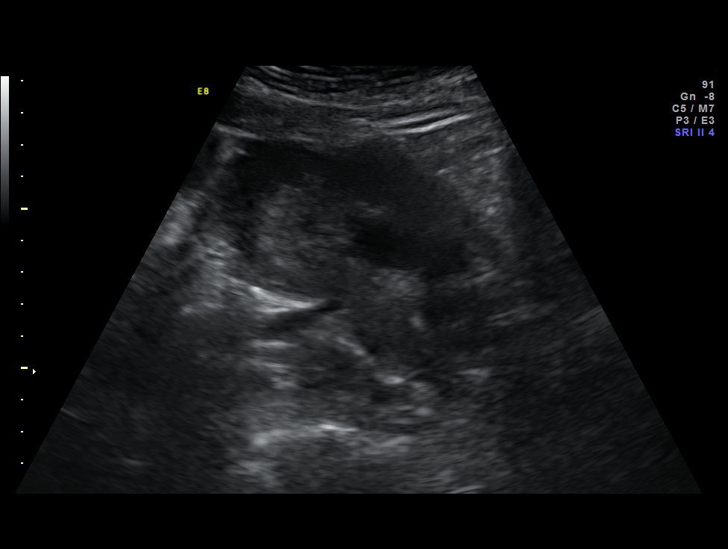
[im 9/49]
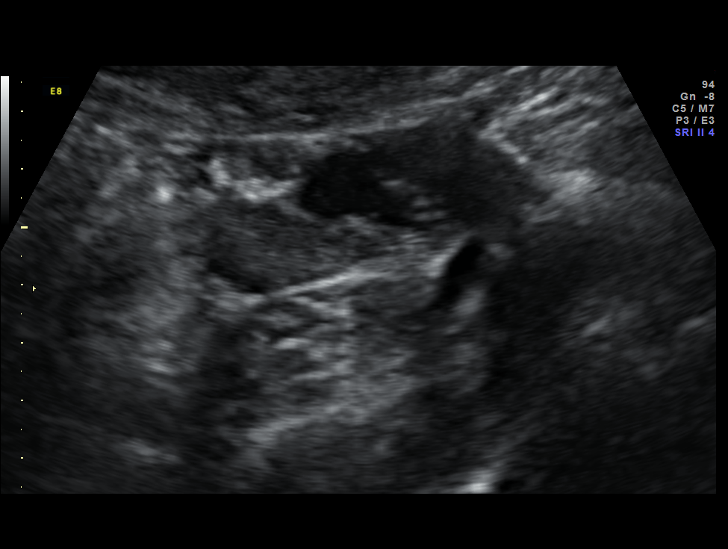
[im 13/49]
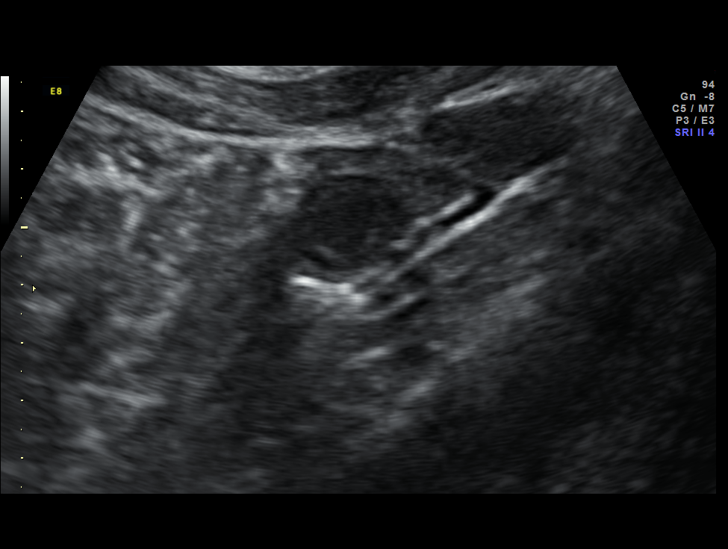
[im 17/49]
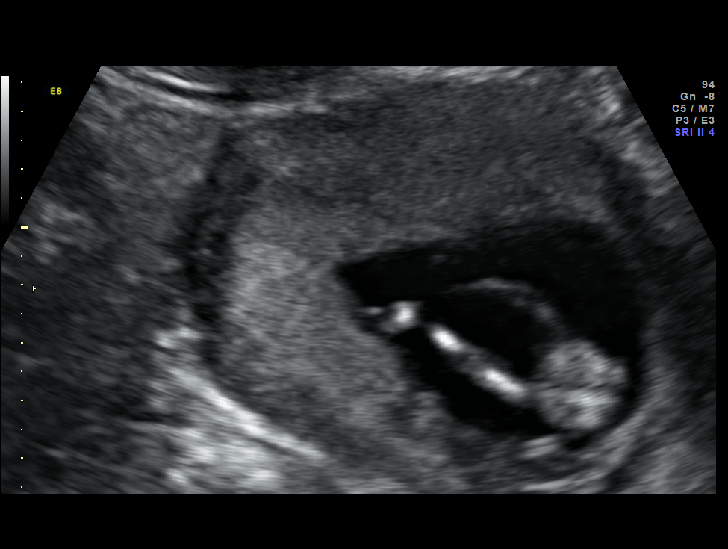
[im 20/49]
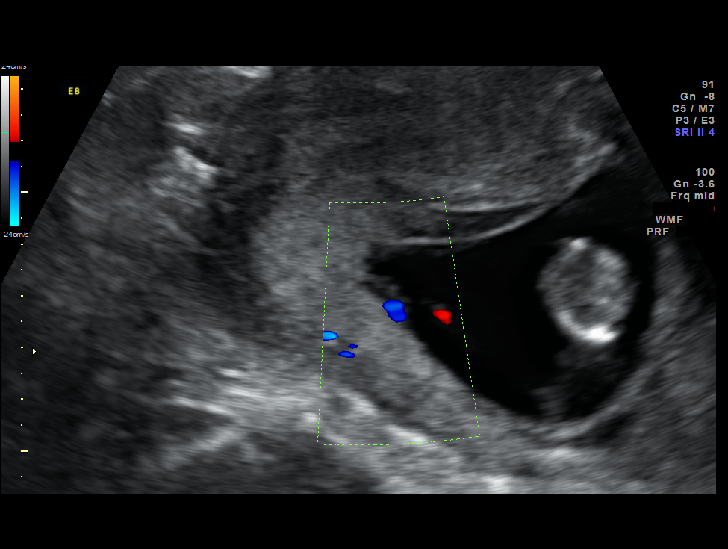
[im 24/49]
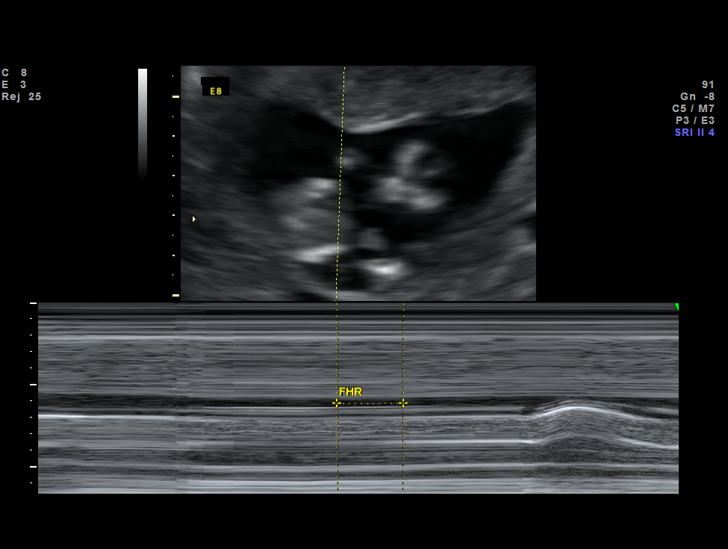
[im 27/49]
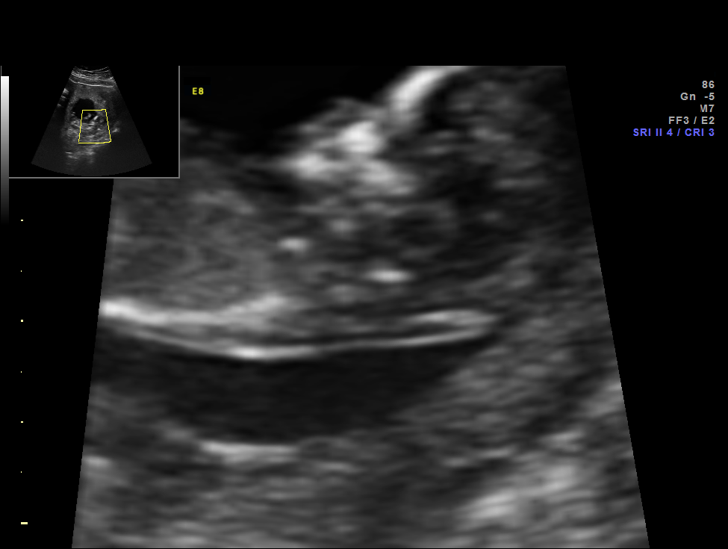
[im 31/49]
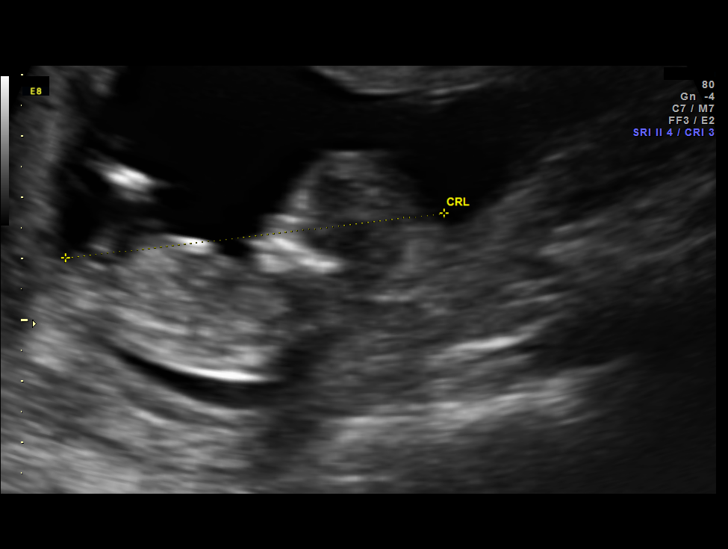
[im 34/49]
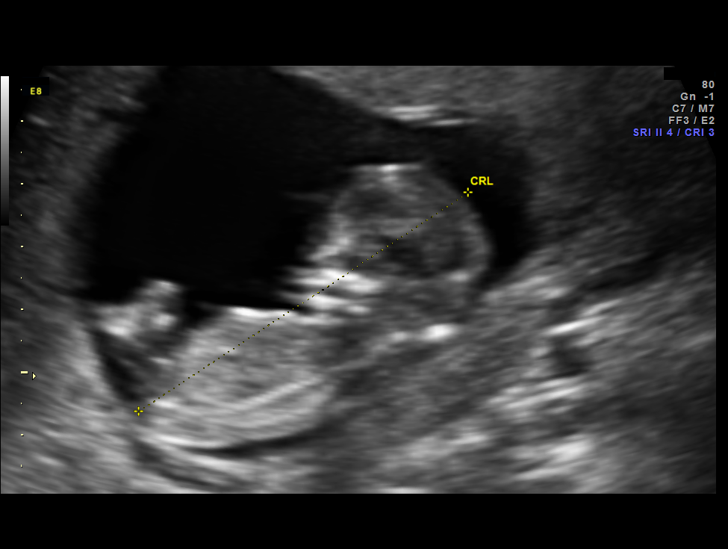
[im 38/49]
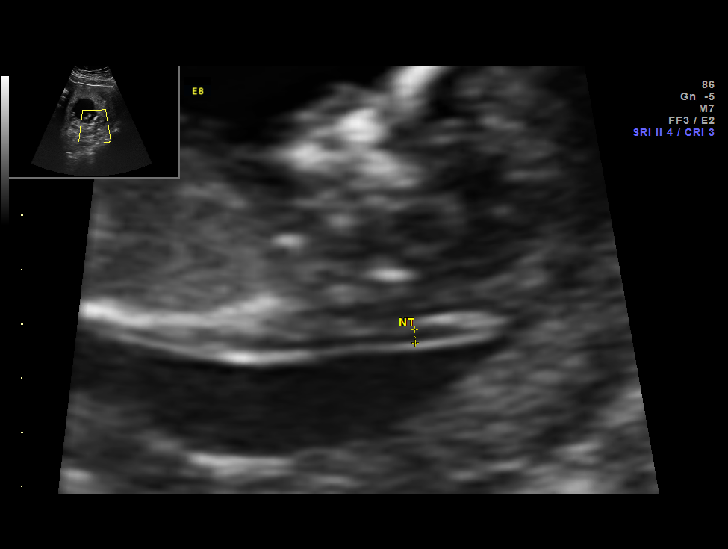
[im 41/49]
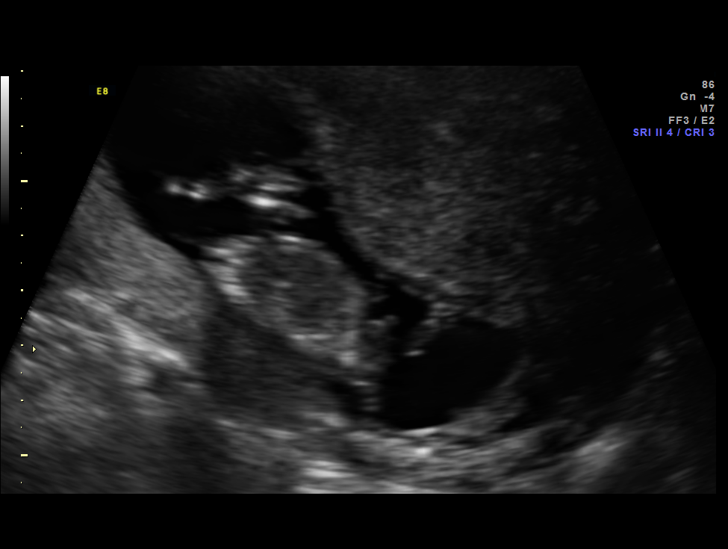
[im 45/49]
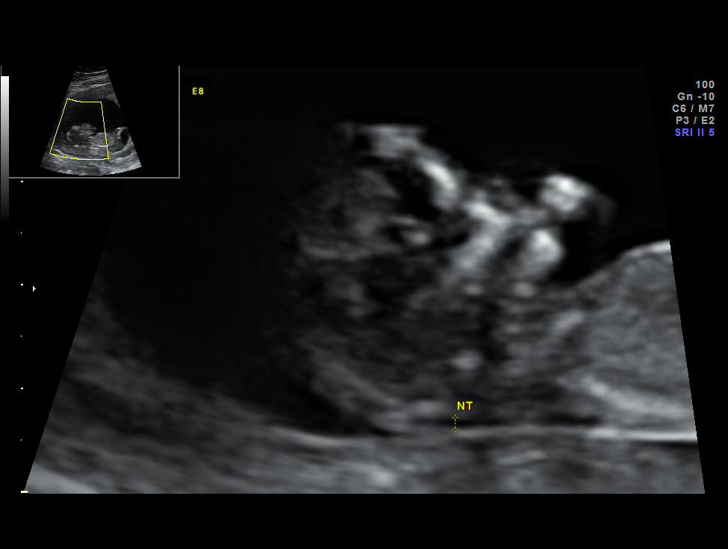
[im 49/49]
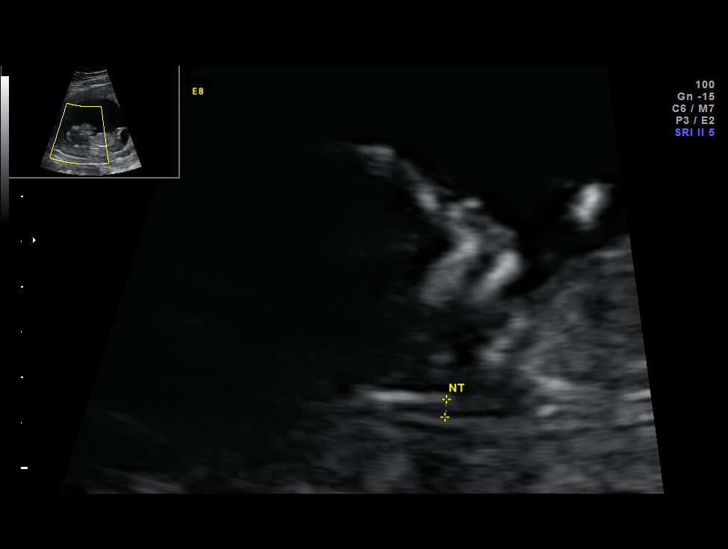

[14 of 28 positions shown; findings below may reference images not displayed]

Canned report from images found in remote index.

Refer to host system for actual result text.

## 2013-06-07 ENCOUNTER — Encounter (HOSPITAL_COMMUNITY): Payer: Self-pay | Admitting: *Deleted

## 2013-06-07 ENCOUNTER — Emergency Department (HOSPITAL_COMMUNITY)
Admission: EM | Admit: 2013-06-07 | Discharge: 2013-06-07 | Disposition: A | Discharge status: Home / Self Care | Payer: Self-pay | Attending: Emergency Medicine | Admitting: Emergency Medicine

## 2013-06-07 DIAGNOSIS — J40 Bronchitis, not specified as acute or chronic: Secondary | ICD-10-CM

## 2013-06-07 DIAGNOSIS — F17200 Nicotine dependence, unspecified, uncomplicated: Secondary | ICD-10-CM | POA: Insufficient documentation

## 2013-06-07 DIAGNOSIS — IMO0001 Reserved for inherently not codable concepts without codable children: Secondary | ICD-10-CM | POA: Insufficient documentation

## 2013-06-07 DIAGNOSIS — J209 Acute bronchitis, unspecified: Secondary | ICD-10-CM | POA: Insufficient documentation

## 2013-06-07 DIAGNOSIS — R509 Fever, unspecified: Secondary | ICD-10-CM | POA: Insufficient documentation

## 2013-06-07 DIAGNOSIS — J069 Acute upper respiratory infection, unspecified: Secondary | ICD-10-CM | POA: Insufficient documentation

## 2013-06-07 DIAGNOSIS — R011 Cardiac murmur, unspecified: Secondary | ICD-10-CM | POA: Insufficient documentation

## 2013-06-07 DIAGNOSIS — Z880 Allergy status to penicillin: Secondary | ICD-10-CM | POA: Insufficient documentation

## 2013-06-07 MED ORDER — PROMETHAZINE-PHENYLEPHRINE 6.25-5 MG/5ML PO SYRP: 5.0000 mL | ORAL_SOLUTION | ORAL | Status: DC | PRN

## 2013-06-07 MED ORDER — ALBUTEROL SULFATE HFA 108 (90 BASE) MCG/ACT IN AERS
2.0000 | INHALATION_SPRAY | RESPIRATORY_TRACT | Status: DC | PRN
  Administered 2013-06-07: 2 via RESPIRATORY_TRACT
  Filled 2013-06-07: qty 6.7

## 2013-06-07 MED ORDER — AZITHROMYCIN 250 MG PO TABS: ORAL_TABLET | ORAL | Status: DC

## 2013-06-07 NOTE — ED Notes (Signed)
Sinus  And chest congestion , chills,  Cough,   T 100.2 this am.  Sore throat.

## 2013-06-07 NOTE — ED Provider Notes (Signed)
CSN: [id number]     Arrival date & time 06/07/13  1138 History   First MD Initiated Contact with Patient 06/07/13 1219     Chief Complaint  Patient presents with  . URI   (Consider location/radiation/quality/duration/timing/severity/associated sxs/prior Treatment) Patient is a 27 y.o. female presenting with URI. The history is provided by the patient.  URI Presenting symptoms: congestion, cough, fever and sore throat   Severity:  Moderate Onset quality:  Gradual Duration:  2 weeks Timing:  Sporadic Progression:  Worsening Chronicity:  New Relieved by:  Nothing Worsened by:  Nothing tried Ineffective treatments:  OTC medications Associated symptoms: myalgias, sinus pain and sneezing   Associated symptoms: no headaches and no wheezing    Lauren Green is a 27 y.o. female who presents to the ED with cough, cold and congestin for over 2 weeks. It started as sinus congestion and then went to the chest. Coughing up white sputum.   Past Medical History  Diagnosis Date  . Heart murmur    Past Surgical History  Procedure Laterality Date  . Dilation and curettage of uterus    . Cesarean section  08/31/2011    Procedure: CESAREAN SECTION;  Surgeon: Levi Aland;  Location: [facility];  Service: Gynecology;  Laterality: N/A;  primary cesarean section of baby boy  at 2  APGAR 9/9   Family History  Problem Relation Age of Onset  . Anesthesia problems Neg Hx   . Hypotension Neg Hx   . Malignant hyperthermia Neg Hx   . Pseudochol deficiency Neg Hx    History  Substance Use Topics  . Smoking status: Current Some Day Smoker    Types: Cigarettes  . Smokeless tobacco: Not on file  . Alcohol Use: No   OB History   Grav Para Term Preterm Abortions TAB SAB Ect Mult Living   3 2 1  0 1 0 0 0 0 2     Review of Systems  Constitutional: Positive for fever.  HENT: Positive for congestion, sore throat and sneezing.   Eyes: Negative for visual disturbance.  Respiratory: Positive for  cough. Negative for shortness of breath and wheezing.   Gastrointestinal: Negative for nausea, vomiting and abdominal pain.  Genitourinary: Negative for dysuria and frequency.  Musculoskeletal: Positive for myalgias.  Skin: Negative for rash.  Allergic/Immunologic: Negative for immunocompromised state.  Neurological: Negative for headaches.  Psychiatric/Behavioral: The patient is not nervous/anxious.     Allergies  Penicillins  Home Medications   Current Outpatient Rx  Name  Route  Sig  Dispense  Refill  . acetaminophen (TYLENOL) 500 MG tablet   Oral   Take 1,000 mg by mouth every 6 (six) hours as needed for pain.         . medroxyPROGESTERone (DEPO-PROVERA) 150 MG/ML injection   Intramuscular   Inject 150 mg into the muscle every 3 (three) months.         Marland Kitchen OVER THE COUNTER MEDICATION   Oral   Take 1 tablet by mouth every 4 (four) hours. OTC sinus relief.          BP 131/73  Pulse 95  Temp(Src) 100 F (37.8 C) (Oral)  Resp 18  Ht 5\' 2"  (1.575 m)  Wt 277 lb (125.646 kg)  BMI 50.65 kg/m2  SpO2 100%  Breastfeeding? No Physical Exam  Nursing note and vitals reviewed. Constitutional: She is oriented to person, place, and time. She appears well-developed and well-nourished. No distress.  HENT:  Head: Normocephalic.  Right Ear: Tympanic membrane normal.  Left Ear: Tympanic membrane normal.  Nose: Mucosal edema and rhinorrhea present. Right sinus exhibits maxillary sinus tenderness. Left sinus exhibits maxillary sinus tenderness.  Mouth/Throat: Uvula is midline, oropharynx is clear and moist and mucous membranes are normal.  Eyes: Conjunctivae and EOM are normal.  Neck: Neck supple.  Cardiovascular: Normal rate and regular rhythm.   Pulmonary/Chest: Effort normal. She has wheezes. Rhonchi: occasional.  Abdominal: Soft. There is no tenderness.  Musculoskeletal: Normal range of motion.  Neurological: She is alert and oriented to person, place, and time. No cranial  nerve deficit.  Skin: Skin is warm and dry.  Psychiatric: She has a normal mood and affect. Her behavior is normal.    ED Course  Procedures  MDM  27 y.o. female with sinus congestion and bronchitis. Will treat symptoms. Patient to follow up with her PCP or return here as needed.  Discussed with the patient and all questioned fully answered.   Medication List    TAKE these medications       azithromycin 250 MG tablet  Commonly known as:  ZITHROMAX Z-PAK  Take 2 tablets PO today and then one daily for infection     promethazine-phenylephrine 6.25-5 MG/5ML Syrp  Commonly known as:  PROMETHAZINE VC  Take 5 mLs by mouth every 4 (four) hours as needed.      ASK your doctor about these medications       acetaminophen 500 MG tablet  Commonly known as:  TYLENOL  Take 1,000 mg by mouth every 6 (six) hours as needed for pain.     medroxyPROGESTERone 150 MG/ML injection  Commonly known as:  DEPO-PROVERA  Inject 150 mg into the muscle every 3 (three) months.     OVER THE COUNTER MEDICATION  Take 1 tablet by mouth every 4 (four) hours. OTC sinus relief.         Janne Napoleon, [place] 06/08/13 1731

## 2013-06-07 NOTE — ED Notes (Signed)
Paged RT for inhaler

## 2013-06-17 NOTE — ED Provider Notes (Signed)
Medical screening examination/treatment/procedure(s) were performed by non-physician practitioner and as supervising physician I was immediately available for consultation/collaboration.   Keinan Brouillet W. Alias Villagran, MD 06/17/13 1122 

## 2014-07-11 ENCOUNTER — Emergency Department (HOSPITAL_COMMUNITY)
Admission: EM | Admit: 2014-07-11 | Discharge: 2014-07-11 | Disposition: A | Discharge status: Home / Self Care | Payer: Self-pay | Attending: Emergency Medicine | Admitting: Emergency Medicine

## 2014-07-11 ENCOUNTER — Encounter (HOSPITAL_COMMUNITY): Payer: Self-pay | Admitting: *Deleted

## 2014-07-11 DIAGNOSIS — W19XXXA Unspecified fall, initial encounter: Secondary | ICD-10-CM

## 2014-07-11 DIAGNOSIS — Z880 Allergy status to penicillin: Secondary | ICD-10-CM | POA: Insufficient documentation

## 2014-07-11 DIAGNOSIS — Z79899 Other long term (current) drug therapy: Secondary | ICD-10-CM | POA: Insufficient documentation

## 2014-07-11 DIAGNOSIS — Z791 Long term (current) use of non-steroidal anti-inflammatories (NSAID): Secondary | ICD-10-CM | POA: Insufficient documentation

## 2014-07-11 DIAGNOSIS — M79651 Pain in right thigh: Secondary | ICD-10-CM

## 2014-07-11 DIAGNOSIS — Z720 Tobacco use: Secondary | ICD-10-CM | POA: Insufficient documentation

## 2014-07-11 DIAGNOSIS — S79921A Unspecified injury of right thigh, initial encounter: Secondary | ICD-10-CM | POA: Insufficient documentation

## 2014-07-11 DIAGNOSIS — Y9289 Other specified places as the place of occurrence of the external cause: Secondary | ICD-10-CM | POA: Insufficient documentation

## 2014-07-11 DIAGNOSIS — W1830XA Fall on same level, unspecified, initial encounter: Secondary | ICD-10-CM | POA: Insufficient documentation

## 2014-07-11 DIAGNOSIS — Y9389 Activity, other specified: Secondary | ICD-10-CM | POA: Insufficient documentation

## 2014-07-11 DIAGNOSIS — Z79818 Long term (current) use of other agents affecting estrogen receptors and estrogen levels: Secondary | ICD-10-CM | POA: Insufficient documentation

## 2014-07-11 DIAGNOSIS — R011 Cardiac murmur, unspecified: Secondary | ICD-10-CM | POA: Insufficient documentation

## 2014-07-11 MED ORDER — CYCLOBENZAPRINE HCL 10 MG PO TABS: 10.0000 mg | ORAL_TABLET | Freq: Two times a day (BID) | ORAL | Status: DC | PRN

## 2014-07-11 MED ORDER — NAPROXEN 500 MG PO TABS: 500.0000 mg | ORAL_TABLET | Freq: Two times a day (BID) | ORAL | Status: DC

## 2014-07-11 NOTE — ED Provider Notes (Signed)
CSN: [id number]     Arrival date & time 07/11/14  1242 History   First MD Initiated Contact with Patient 07/11/14 1421     Chief Complaint  Patient presents with  . Fall     (Consider location/radiation/quality/duration/timing/severity/associated sxs/prior Treatment) HPI  Patient slipped on wet grass this morning hyperextending her right hip. Now complains of anterior thigh muscular pain. No numbness or tingling. No other injuries. Severity is mild-to-moderate.  Past Medical History  Diagnosis Date  . Heart murmur    Past Surgical History  Procedure Laterality Date  . Dilation and curettage of uterus    . Cesarean section  08/31/2011    Procedure: CESAREAN SECTION;  Surgeon: Levi AlandMark E Anderson;  Location: [facility];  Service: Gynecology;  Laterality: N/A;  primary cesarean section of baby boy  at [id number]  APGAR 9/9   Family History  Problem Relation Age of Onset  . Anesthesia problems Neg Hx   . Hypotension Neg Hx   . Malignant hyperthermia Neg Hx   . Pseudochol deficiency Neg Hx    History  Substance Use Topics  . Smoking status: Current Some Day Smoker    Types: Cigarettes  . Smokeless tobacco: Not on file  . Alcohol Use: No   OB History    Gravida Para Term Preterm AB TAB SAB Ectopic Multiple Living   3 2 1  0 1 0 0 0 0 2     Review of Systems  All other systems reviewed and are negative.     Allergies  Penicillins  Home Medications   Prior to Admission medications   Medication Sig Start Date End Date Taking? Authorizing Provider  acetaminophen (TYLENOL) 500 MG tablet Take 1,000 mg by mouth every 6 (six) hours as needed for pain.   Yes Historical Provider, MD  medroxyPROGESTERone (DEPO-PROVERA) 150 MG/ML injection Inject 150 mg into the muscle every 3 (three) months.   Yes Historical Provider, MD  azithromycin (ZITHROMAX Z-PAK) 250 MG tablet Take 2 tablets PO today and then one daily for infection 06/07/13   [facility] Orlene OchM Neese, NP  cyclobenzaprine (FLEXERIL) 10 MG tablet  Take 1 tablet (10 mg total) by mouth 2 (two) times daily as needed for muscle spasms. 07/11/14   Donnetta HutchingBrian Berthold Glace, MD  naproxen (NAPROSYN) 500 MG tablet Take 1 tablet (500 mg total) by mouth 2 (two) times daily. 07/11/14   Donnetta HutchingBrian Makaiyah Schweiger, MD  OVER THE COUNTER MEDICATION Take 1 tablet by mouth every 4 (four) hours. OTC sinus relief.    Historical Provider, MD  promethazine-phenylephrine (PROMETHAZINE VC) 6.25-5 MG/5ML SYRP Take 5 mLs by mouth every 4 (four) hours as needed. 06/07/13   Hope Orlene OchM Neese, NP   BP 120/65 mmHg  Pulse 74  Temp(Src) 99.4 F (37.4 C) (Oral)  Resp 18  Ht 5\' 2"  (1.575 m)  Wt 275 lb (124.739 kg)  BMI 50.29 kg/m2  SpO2 100% Physical Exam  Constitutional: She is oriented to person, place, and time. She appears well-developed and well-nourished.  HENT:  Head: Normocephalic and atraumatic.  Eyes: Conjunctivae and EOM are normal. Pupils are equal, round, and reactive to light.  Neck: Normal range of motion. Neck supple.  Cardiovascular: Normal rate, regular rhythm and normal heart sounds.   Pulmonary/Chest: Effort normal and breath sounds normal.  Abdominal: Soft. Bowel sounds are normal.  Musculoskeletal:  Tender over right quadriceps muscle. Pain with flexion and extension at hip.  Neurological: She is alert and oriented to person, place, and time.  Skin: Skin is  warm and dry.  Psychiatric: She has a normal mood and affect. Her behavior is normal.  Nursing note and vitals reviewed.   ED Course  Procedures (including critical care time) Labs Review Labs Reviewed - No data to display  Imaging Review No results found.   EKG Interpretation None      MDM   Final diagnoses:  Fall, initial encounter  Right thigh pain    Patient is in no acute distress. She is tender over her quadriceps muscle belly.  No imaging necessary. Discharge medications Naprosyn 500 mg and Flexeril 10 mg    Donnetta HutchingBrian Lilian Fuhs, MD 07/11/14 1513

## 2014-07-11 NOTE — Discharge Instructions (Signed)
Ace wrap, ice, meds for pain and muscle spasm

## 2014-07-11 NOTE — ED Notes (Signed)
Larey SeatFell this am on wet grass, Pain to rt thigh

## 2015-05-12 ENCOUNTER — Emergency Department (HOSPITAL_COMMUNITY)
Admission: EM | Admit: 2015-05-12 | Discharge: 2015-05-12 | Disposition: A | Discharge status: Home / Self Care | Payer: Self-pay | Attending: Physician Assistant | Admitting: Physician Assistant

## 2015-05-12 ENCOUNTER — Encounter (HOSPITAL_COMMUNITY): Payer: Self-pay | Admitting: Emergency Medicine

## 2015-05-12 DIAGNOSIS — E669 Obesity, unspecified: Secondary | ICD-10-CM | POA: Insufficient documentation

## 2015-05-12 DIAGNOSIS — Z880 Allergy status to penicillin: Secondary | ICD-10-CM | POA: Insufficient documentation

## 2015-05-12 DIAGNOSIS — Z3202 Encounter for pregnancy test, result negative: Secondary | ICD-10-CM | POA: Insufficient documentation

## 2015-05-12 DIAGNOSIS — R011 Cardiac murmur, unspecified: Secondary | ICD-10-CM | POA: Insufficient documentation

## 2015-05-12 DIAGNOSIS — Z720 Tobacco use: Secondary | ICD-10-CM | POA: Insufficient documentation

## 2015-05-12 DIAGNOSIS — G43809 Other migraine, not intractable, without status migrainosus: Secondary | ICD-10-CM | POA: Insufficient documentation

## 2015-05-12 HISTORY — DX: Migraine, unspecified, not intractable, without status migrainosus: G43.909

## 2015-05-12 LAB — POC URINE PREG, ED: Preg Test, Ur: NEGATIVE

## 2015-05-12 MED ORDER — BUTALBITAL-APAP-CAFFEINE 50-325-40 MG PO TABS: 1.0000 | ORAL_TABLET | Freq: Four times a day (QID) | ORAL | Status: AC | PRN

## 2015-05-12 NOTE — ED Provider Notes (Signed)
CSN: [id number]     Arrival date & time 05/12/15  1644 History   First MD Initiated Contact with Patient 05/12/15 1659     Chief Complaint  Patient presents with  . Migraine     (Consider location/radiation/quality/duration/timing/severity/associated sxs/prior Treatment) HPI   Patient is a 29 year old female presenting with migraine. Today patient actually does not have a migraine . However she has no primary care physician and looking to be on a therapy that can reduce her number of migraines per month. Patient states she is on a waiting list on 2 PCPs in the area.    Past Medical History  Diagnosis Date  . Heart murmur   . Migraine    Past Surgical History  Procedure Laterality Date  . Dilation and curettage of uterus    . Cesarean section  08/31/2011    Procedure: CESAREAN SECTION;  Surgeon: Levi Aland;  Location: [facility];  Service: Gynecology;  Laterality: N/A;  primary cesarean section of baby boy  at 70  APGAR 9/9   Family History  Problem Relation Age of Onset  . Anesthesia problems Neg Hx   . Hypotension Neg Hx   . Malignant hyperthermia Neg Hx   . Pseudochol deficiency Neg Hx    Social History  Substance Use Topics  . Smoking status: Current Every Day Smoker -- 0.50 packs/day    Types: Cigarettes  . Smokeless tobacco: None  . Alcohol Use: Yes     Comment: occ   OB History    Gravida Para Term Preterm AB TAB SAB Ectopic Multiple Living   3 2 1  0 1 0 0 0 0 2     Review of Systems  Constitutional: Negative for activity change.  Respiratory: Negative for shortness of breath.   Cardiovascular: Negative for chest pain.  Gastrointestinal: Negative for abdominal pain.  Neurological: Positive for headaches. Negative for tremors, seizures, syncope, speech difficulty, weakness and numbness.      Allergies  Penicillins  Home Medications   Prior to Admission medications   Medication Sig Start Date End Date Taking? Authorizing Provider  acetaminophen  (TYLENOL) 500 MG tablet Take 1,000 mg by mouth every 6 (six) hours as needed for pain.   Yes Historical Provider, MD  Acetaminophen-Caffeine (TENSION HEADACHE) 500-65 MG TABS Take 1-2 tablets by mouth daily as needed (for migraine).   Yes Historical Provider, MD  aspirin-acetaminophen-caffeine (EXCEDRIN MIGRAINE) [phone] MG per tablet Take 2 tablets by mouth daily as needed for headache or migraine.   Yes Historical Provider, MD  medroxyPROGESTERone (DEPO-PROVERA) 150 MG/ML injection Inject 150 mg into the muscle every 3 (three) months.   Yes Historical Provider, MD   BP 120/77 mmHg  Pulse 71  Temp(Src) 99 F (37.2 C) (Oral)  Resp 18  Ht 5\' 2"  (1.575 m)  Wt 274 lb (124.286 kg)  BMI 50.10 kg/m2  SpO2 98%  LMP 05/11/2015 Physical Exam  Constitutional: She is oriented to person, place, and time. She appears well-developed and well-nourished.  Obese African female  HENT:  Head: Normocephalic and atraumatic.  Eyes: Right eye exhibits no discharge.  No papilledema  Cardiovascular:  No murmur heard. Pulmonary/Chest: Effort normal.  Neurological: She is oriented to person, place, and time. No cranial nerve deficit.  Skin: Skin is warm and dry. She is not diaphoretic.  Psychiatric: She has a normal mood and affect.  Nursing note and vitals reviewed.   ED Course  Procedures (including critical care time) Labs Review Labs Reviewed  POC  URINE PREG, ED  POC URINE PREG, ED    Imaging Review No results found. I have personally reviewed and evaluated these images and lab results as part of my medical decision-making.   EKG Interpretation None      MDM   Final diagnoses:  None  29 yo here with migraines.   There no red flags about her migraines. They're not in the morning. They're usually associated with photophobia and nausea. Patient had them for a number number of years. Could consider pseudotumor however given she is no neurological symptoms and no papilledema.   We will  give her 1 month supply of Fioricet to try at home. That was when she gets to see her primary care she can tell them whether worked or not.    Rajveer Handler Randall An, MD 05/12/15 1742

## 2015-05-12 NOTE — ED Notes (Signed)
Pt c/o of migraine, RT sided facial pain, light sensitivity, and N/V x 1 week. Pt has hx of migraines. AOx4.

## 2015-05-12 NOTE — Discharge Instructions (Signed)
We are going to give you a medication that can help stop a migraine on Migraine Headache A migraine headache is an intense, throbbing pain on one or both sides of your head. A migraine can last for 30 minutes to several hours. CAUSES  The exact cause of a migraine headache is not always known. However, a migraine may be caused when nerves in the brain become irritated and release chemicals that cause inflammation. This causes pain. Certain things may also trigger migraines, such as:  Alcohol.  Smoking.  Stress.  Menstruation.  Aged cheeses.  Foods or drinks that contain nitrates, glutamate, aspartame, or tyramine.  Lack of sleep.  Chocolate.  Caffeine.  Hunger.  Physical exertion.  Fatigue.  Medicines used to treat chest pain (nitroglycerine), birth control pills, estrogen, and some blood pressure medicines. SIGNS AND SYMPTOMS  Pain on one or both sides of your head.  Pulsating or throbbing pain.  Severe pain that prevents daily activities.  Pain that is aggravated by any physical activity.  Nausea, vomiting, or both.  Dizziness.  Pain with exposure to bright lights, loud noises, or activity.  General sensitivity to bright lights, loud noises, or smells. Before you get a migraine, you may get warning signs that a migraine is coming (aura). An aura may include:  Seeing flashing lights.  Seeing bright spots, halos, or zigzag lines.  Having tunnel vision or blurred vision.  Having feelings of numbness or tingling.  Having trouble talking.  Having muscle weakness. DIAGNOSIS  A migraine headache is often diagnosed based on:  Symptoms.  Physical exam.  A CT scan or MRI of your head. These imaging tests cannot diagnose migraines, but they can help rule out other causes of headaches. TREATMENT Medicines may be given for pain and nausea. Medicines can also be given to help prevent recurrent migraines.  HOME CARE INSTRUCTIONS  Only take over-the-counter  or prescription medicines for pain or discomfort as directed by your health care provider. The use of long-term narcotics is not recommended.  Lie down in a dark, quiet room when you have a migraine.  Keep a journal to find out what may trigger your migraine headaches. For example, write down:  What you eat and drink.  How much sleep you get.  Any change to your diet or medicines.  Limit alcohol consumption.  Quit smoking if you smoke.  Get 7-9 hours of sleep, or as recommended by your health care provider.  Limit stress.  Keep lights dim if bright lights bother you and make your migraines worse. SEEK IMMEDIATE MEDICAL CARE IF:   Your migraine becomes severe.  You have a fever.  You have a stiff neck.  You have vision loss.  You have muscular weakness or loss of muscle control.  You start losing your balance or have trouble walking.  You feel faint or pass out.  You have severe symptoms that are different from your first symptoms. MAKE SURE YOU:   Understand these instructions.  Will watch your condition.  Will get help right away if you are not doing well or get worse. Document Released: 08/26/2005 Document Revised: 01/10/2014 Document Reviewed: 05/03/2013 [facility] Patient Information 2015 [place], [place]. This information is not intended to replace advice given to you by your health care provider. Make sure you discuss any questions you have with your health care provider.  Migraine Headache A migraine headache is very bad, throbbing pain on one or both sides of your head. Talk to your doctor about what  things may bring on (trigger) your migraine headaches. HOME CARE  Only take medicines as told by your doctor.  Lie down in a dark, quiet room when you have a migraine.  Keep a journal to find out if certain things bring on migraine headaches. For example, write down:  What you eat and drink.  How much sleep you get.  Any change to your diet or  medicines.  Lessen how much alcohol you drink.  Quit smoking if you smoke.  Get enough sleep.  Lessen any stress in your life.  Keep lights dim if bright lights bother you or make your migraines worse. GET HELP RIGHT AWAY IF:   Your migraine becomes really bad.  You have a fever.  You have a stiff neck.  You have trouble seeing.  Your muscles are weak, or you lose muscle control.  You lose your balance or have trouble walking.  You feel like you will pass out (faint), or you pass out.  You have really bad symptoms that are different than your first symptoms. MAKE SURE YOU:   Understand these instructions.  Will watch your condition.  Will get help right away if you are not doing well or get worse. Document Released: 06/04/2008 Document Revised: 11/18/2011 Document Reviewed: 05/03/2013 [facility] Patient Information 2015 [place], [place]. This information is not intended to replace advice given to you by your health care provider. Make sure you discuss any questions you have with your health care provider. ce you get it. It is colicky or set. Please use it only once you start having headache,then take immediately to help stop it.

## 2016-05-31 ENCOUNTER — Ambulatory Visit: Payer: Self-pay | Admitting: Physician Assistant

## 2016-06-03 ENCOUNTER — Encounter: Payer: Self-pay | Admitting: Physician Assistant

## 2016-06-11 ENCOUNTER — Encounter: Payer: Self-pay | Admitting: Physician Assistant

## 2016-06-11 ENCOUNTER — Ambulatory Visit (INDEPENDENT_AMBULATORY_CARE_PROVIDER_SITE_OTHER): Payer: BLUE CROSS/BLUE SHIELD | Admitting: Physician Assistant

## 2016-06-11 VITALS — BP 85/62 | HR 60 | Temp 98.0°F | Ht 62.0 in | Wt 249.0 lb

## 2016-06-11 DIAGNOSIS — N938 Other specified abnormal uterine and vaginal bleeding: Secondary | ICD-10-CM

## 2016-06-11 DIAGNOSIS — G43809 Other migraine, not intractable, without status migrainosus: Secondary | ICD-10-CM | POA: Diagnosis not present

## 2016-06-11 DIAGNOSIS — Z23 Encounter for immunization: Secondary | ICD-10-CM | POA: Diagnosis not present

## 2016-06-11 MED ORDER — TOPIRAMATE 50 MG PO TABS: 100.0000 mg | ORAL_TABLET | Freq: Every day | ORAL | 11 refills | Status: DC

## 2016-06-11 MED ORDER — RIZATRIPTAN BENZOATE 10 MG PO TABS: 10.0000 mg | ORAL_TABLET | ORAL | 11 refills | Status: DC | PRN

## 2016-06-11 MED ORDER — MEDROXYPROGESTERONE ACETATE 150 MG/ML IM SUSP: 150.0000 mg | INTRAMUSCULAR | 3 refills | Status: DC

## 2016-06-11 NOTE — Patient Instructions (Signed)

## 2016-06-11 NOTE — Progress Notes (Signed)
BP (!) 85/62   Pulse 60   Temp 98 F (36.7 C) (Oral)   Ht 5\' 2"  (1.575 m)   Wt 249 lb (112.9 kg)   BMI 45.54 kg/m    Subjective:    Patient ID: Lauren Green, female    DOB: 1986-03-25, 30 y.o.   MRN: [id number]  HPI: Lauren Green is a 30 y.o. female presenting on 06/11/2016 for Depo Provera shot (patient here to have DepoShot; patient reports she is late for having shot)  This patient comes in today for updates on medications and review of her medical history. Her migraines were doing quite well with Topamax as prevention and Maxalt as treatment. She has been out of her medicine for about a month. She also states that the Topamax had a tremendous decrease of her appetite and she is down 30 pounds. She is quite excited about this finding.  She was concerned about this being a medicine she cannot take long-term. I have reiterated that it is designed to be taken long-term and there is nothing wrong with her taking. In addition she needs her Depo-Provera injection. She is not sexually active whatsoever in the past month. She is over about 2 weeks on her injection. She will need to go pick this up at the pharmacy and bring it here for the triage nurse to administer. There are no other complaints at this time.   Relevant past medical, surgical, family and social history reviewed and updated as indicated. Interim medical history since our last visit reviewed. Allergies and medications reviewed and updated. DATA REVIEWED: CHART IN EPIC  Social History   Social History  . Marital status: Single    Spouse name: N/A  . Number of children: N/A  . Years of education: N/A   Occupational History  . Not on file.   Social History Main Topics  . Smoking status: Current Every Day Smoker    Packs/day: 0.50    Types: Cigarettes  . Smokeless tobacco: Never Used  . Alcohol use Yes     Comment: occ  . Drug use: No  . Sexual activity: Yes    Birth control/ protection: Injection    Other Topics Concern  . Not on file   Social History Narrative  . No narrative on file    Past Surgical History:  Procedure Laterality Date  . CESAREAN SECTION  08/31/2011   Procedure: CESAREAN SECTION;  Surgeon: Levi AlandMark E Anderson;  Location: [facility];  Service: Gynecology;  Laterality: N/A;  primary cesarean section of baby boy  at [id number]  APGAR 9/9  . DILATION AND CURETTAGE OF UTERUS      Family History  Problem Relation Age of Onset  . Anesthesia problems Neg Hx   . Hypotension Neg Hx   . Malignant hyperthermia Neg Hx   . Pseudochol deficiency Neg Hx     Review of Systems  Constitutional: Negative.  Negative for activity change, fatigue and fever.  HENT: Negative.   Eyes: Negative.   Respiratory: Negative.  Negative for cough.   Cardiovascular: Negative.  Negative for chest pain.  Gastrointestinal: Negative.  Negative for abdominal pain.  Endocrine: Negative.   Genitourinary: Negative.  Negative for dysuria.  Musculoskeletal: Negative.   Skin: Negative.   Neurological: Negative.       Medication List       Accurate as of 06/11/16  3:19 PM. Always use your most recent med list.  acetaminophen 500 MG tablet Commonly known as:  TYLENOL Take 1,000 mg by mouth every 6 (six) hours as needed for pain.   medroxyPROGESTERone 150 MG/ML injection Commonly known as:  DEPO-PROVERA Inject 1 mL (150 mg total) into the muscle every 3 (three) months.   rizatriptan 10 MG tablet Commonly known as:  MAXALT Take 1 tablet (10 mg total) by mouth as needed.   topiramate 50 MG tablet Commonly known as:  TOPAMAX Take 2 tablets (100 mg total) by mouth daily.          Objective:    BP (!) 85/62   Pulse 60   Temp 98 F (36.7 C) (Oral)   Ht 5\' 2"  (1.575 m)   Wt 249 lb (112.9 kg)   BMI 45.54 kg/m   Allergies  Allergen Reactions  . Penicillins     Wt Readings from Last 3 Encounters:  06/11/16 249 lb (112.9 kg)  05/12/15 274 lb (124.3 kg)  07/11/14 275 lb  (124.7 kg)    Physical Exam  Constitutional: She is oriented to person, place, and time. She appears well-developed and well-nourished.  HENT:  Head: Normocephalic and atraumatic.  Eyes: Conjunctivae and EOM are normal. Pupils are equal, round, and reactive to light.  Neck: Normal range of motion. Neck supple.  Cardiovascular: Normal rate, regular rhythm, normal heart sounds and intact distal pulses.   Pulmonary/Chest: Effort normal and breath sounds normal.  Abdominal: Soft. Bowel sounds are normal.  Neurological: She is alert and oriented to person, place, and time. She has normal reflexes.  Skin: Skin is warm and dry. No rash noted.  Psychiatric: She has a normal mood and affect. Her behavior is normal. Judgment and thought content normal.  Nursing note and vitals reviewed.   Results for orders placed or performed during the hospital encounter of 05/12/15  POC urine preg, ED (not at [facility])  Result Value Ref Range   Preg Test, Ur NEGATIVE NEGATIVE      Assessment & Plan:   1. DUB (dysfunctional uterine bleeding) Continue you Depo-Provera injection every 3 months. Prescription will be sent to pharmacy. Patient will bring to clinic for triage nurse to administer.  2. Other migraine without status migrainosus, not intractable Continue Maxalt 10 mg one at the start of headache Continue Topamax 50 mg 1-2 daily for migraine prevention Continue Tylenol if needed for lesser headache  3. Morbid obesity (HCC) Continue diet and exercise. She is down a total of 30 pounds   Continue all other maintenance medications as listed above.  Follow up plan: Return in about 3 months (around 09/11/2016) for PAP, next depoprovera.   No orders of the defined types were placed in this encounter.   Educational handout given for migraine.  Remus Loffler PA-C [facility] [address]  [place], [place] [address] [phone]   06/11/2016, 3:19 PM

## 2016-06-12 ENCOUNTER — Ambulatory Visit (INDEPENDENT_AMBULATORY_CARE_PROVIDER_SITE_OTHER): Payer: BLUE CROSS/BLUE SHIELD | Admitting: *Deleted

## 2016-06-12 DIAGNOSIS — Z3049 Encounter for surveillance of other contraceptives: Secondary | ICD-10-CM

## 2016-06-12 MED ORDER — MEDROXYPROGESTERONE ACETATE 150 MG/ML IM SUSP
150.0000 mg | Freq: Once | INTRAMUSCULAR | Status: AC
  Administered 2016-06-12: 150 mg via INTRAMUSCULAR

## 2016-06-12 NOTE — Progress Notes (Signed)
Depo-provera inj given Pt tolerated well

## 2016-08-28 ENCOUNTER — Ambulatory Visit: Payer: BLUE CROSS/BLUE SHIELD

## 2016-08-29 ENCOUNTER — Ambulatory Visit: Payer: BLUE CROSS/BLUE SHIELD

## 2016-09-12 ENCOUNTER — Ambulatory Visit (INDEPENDENT_AMBULATORY_CARE_PROVIDER_SITE_OTHER): Payer: BLUE CROSS/BLUE SHIELD | Admitting: *Deleted

## 2016-09-12 DIAGNOSIS — Z3042 Encounter for surveillance of injectable contraceptive: Secondary | ICD-10-CM | POA: Diagnosis not present

## 2016-09-12 LAB — PREGNANCY, URINE: Preg Test, Ur: NEGATIVE

## 2016-09-12 MED ORDER — MEDROXYPROGESTERONE ACETATE 150 MG/ML IM SUSP
150.0000 mg | Freq: Once | INTRAMUSCULAR | Status: AC
  Administered 2016-09-12: 150 mg via INTRAMUSCULAR

## 2016-09-12 NOTE — Progress Notes (Signed)
Pt given Medroxyprogesterone Pt tolerated well

## 2016-11-20 ENCOUNTER — Ambulatory Visit (INDEPENDENT_AMBULATORY_CARE_PROVIDER_SITE_OTHER): Payer: BLUE CROSS/BLUE SHIELD | Admitting: Family Medicine

## 2016-11-20 ENCOUNTER — Encounter: Payer: Self-pay | Admitting: Family Medicine

## 2016-11-20 VITALS — BP 122/75 | HR 95 | Temp 100.3°F | Ht 62.0 in | Wt 257.4 lb

## 2016-11-20 DIAGNOSIS — J0100 Acute maxillary sinusitis, unspecified: Secondary | ICD-10-CM

## 2016-11-20 NOTE — Progress Notes (Signed)
BP 122/75   Pulse 95   Temp 100.3 F (37.9 C) (Oral)   Ht 5\' 2"  (1.575 m)   Wt 257 lb 6 oz (116.7 kg)   BMI 47.07 kg/m    Subjective:    Patient ID: Lauren Green, female    DOB: [birth year], 31 y.o.   MRN: [id number]  HPI: Lauren Green is a 31 y.o. female presenting on 11/20/2016 for Chest congestion, cough; Chills; Generalized Body Aches; and Headache   HPI Chest congestion and cough and chills and body aches Patient has been having cough and chest congestion and postnasal drainage and sinus congestion that's been going on for the past 4 or 5 days. She denies any fevers but has had chills that started just yesterday. She feels like the cough and drainage has been getting worse over the past 4-5 days. She does have a low-grade fever of 100.3 today. She denies any shortness of breath or wheezing. She has been having some chest congestion and nasal congestion as well. She has used Tylenol Sinus and cold which did not seem to help much.  Relevant past medical, surgical, family and social history reviewed and updated as indicated. Interim medical history since our last visit reviewed. Allergies and medications reviewed and updated.  Review of Systems  Constitutional: Positive for chills and fever.  HENT: Positive for congestion, postnasal drip, rhinorrhea, sinus pressure and sore throat. Negative for ear discharge, ear pain and sneezing.   Eyes: Negative for pain, redness and visual disturbance.  Respiratory: Positive for cough. Negative for chest tightness and shortness of breath.   Cardiovascular: Negative for chest pain and leg swelling.  Genitourinary: Negative for difficulty urinating and dysuria.  Musculoskeletal: Negative for back pain and gait problem.  Skin: Negative for rash.  Neurological: Negative for light-headedness and headaches.  Psychiatric/Behavioral: Negative for agitation and behavioral problems.  All other systems reviewed and are negative.   Per HPI  unless specifically indicated above   Allergies as of 11/20/2016      Reactions   Penicillins       Medication List       Accurate as of 11/20/16 11:18 AM. Always use your most recent med list.          acetaminophen 500 MG tablet Commonly known as:  TYLENOL Take 1,000 mg by mouth every 6 (six) hours as needed for pain.   medroxyPROGESTERone 150 MG/ML injection Commonly known as:  DEPO-PROVERA Inject 1 mL (150 mg total) into the muscle every 3 (three) months.   rizatriptan 10 MG tablet Commonly known as:  MAXALT Take 1 tablet (10 mg total) by mouth as needed.   topiramate 50 MG tablet Commonly known as:  TOPAMAX Take 2 tablets (100 mg total) by mouth daily.          Objective:    BP 122/75   Pulse 95   Temp 100.3 F (37.9 C) (Oral)   Ht 5\' 2"  (1.575 m)   Wt 257 lb 6 oz (116.7 kg)   BMI 47.07 kg/m   Wt Readings from Last 3 Encounters:  11/20/16 257 lb 6 oz (116.7 kg)  06/11/16 249 lb (112.9 kg)  05/12/15 274 lb (124.3 kg)    Physical Exam  Constitutional: She is oriented to person, place, and time. She appears well-developed and well-nourished. No distress.  HENT:  Right Ear: Tympanic membrane, external ear and ear canal normal.  Left Ear: Tympanic membrane, external ear and ear canal normal.  Nose:  Mucosal edema and rhinorrhea present. No epistaxis. Right sinus exhibits no maxillary sinus tenderness and no frontal sinus tenderness. Left sinus exhibits no maxillary sinus tenderness and no frontal sinus tenderness.  Mouth/Throat: Uvula is midline and mucous membranes are normal. Posterior oropharyngeal edema and posterior oropharyngeal erythema present. No oropharyngeal exudate or tonsillar abscesses.  Eyes: Conjunctivae are normal.  Cardiovascular: Normal rate, regular rhythm, normal heart sounds and intact distal pulses.   No murmur heard. Pulmonary/Chest: Effort normal and breath sounds normal. No respiratory distress. She has no wheezes. She has no rales.    Musculoskeletal: Normal range of motion. She exhibits no edema or tenderness.  Neurological: She is alert and oriented to person, place, and time. Coordination normal.  Skin: Skin is warm and dry. No rash noted. She is not diaphoretic.  Psychiatric: She has a normal mood and affect. Her behavior is normal.  Vitals reviewed.     Assessment & Plan:   Problem List Items Addressed This Visit    None    Visit Diagnoses    Acute non-recurrent maxillary sinusitis    -  Primary   Recommended Flonase and Mucinex, and an antihistamine, nasal saline, return or call back if not better in 4 days   Relevant Orders   Veritor Flu A/B Waived       Follow up plan: Return if symptoms worsen or fail to improve.  Counseling provided for all of the vaccine components Orders Placed This Encounter  Procedures  . Veritor Flu A/B Reynolds BowlWaived    Joshua Dettinger, MD [facility] 11/20/2016, 11:18 AM

## 2016-12-04 ENCOUNTER — Ambulatory Visit (INDEPENDENT_AMBULATORY_CARE_PROVIDER_SITE_OTHER): Payer: BLUE CROSS/BLUE SHIELD | Admitting: *Deleted

## 2016-12-04 DIAGNOSIS — Z3042 Encounter for surveillance of injectable contraceptive: Secondary | ICD-10-CM

## 2016-12-04 MED ORDER — MEDROXYPROGESTERONE ACETATE 150 MG/ML IM SUSP
150.0000 mg | INTRAMUSCULAR | Status: DC
  Administered 2016-12-04 – 2019-07-28 (×8): 150 mg via INTRAMUSCULAR

## 2016-12-04 NOTE — Progress Notes (Signed)
Medroxyprogesterone injection given Pt tolerated well

## 2017-02-20 ENCOUNTER — Ambulatory Visit: Payer: BLUE CROSS/BLUE SHIELD

## 2017-02-28 ENCOUNTER — Telehealth: Payer: Self-pay | Admitting: *Deleted

## 2017-02-28 NOTE — Telephone Encounter (Signed)
FYI

## 2017-11-01 ENCOUNTER — Other Ambulatory Visit: Payer: Self-pay | Admitting: Physician Assistant

## 2017-11-03 ENCOUNTER — Ambulatory Visit: Payer: BLUE CROSS/BLUE SHIELD

## 2017-11-04 ENCOUNTER — Other Ambulatory Visit: Payer: Self-pay | Admitting: Physician Assistant

## 2017-11-04 ENCOUNTER — Ambulatory Visit: Payer: BLUE CROSS/BLUE SHIELD

## 2017-11-04 MED ORDER — MEDROXYPROGESTERONE ACETATE 150 MG/ML IM SUSP: 150.0000 mg | INTRAMUSCULAR | 3 refills | Status: DC

## 2017-11-04 NOTE — Telephone Encounter (Signed)
LMOVM refill sent to pharmacy 

## 2017-11-04 NOTE — Telephone Encounter (Signed)
Pt has appt here today

## 2017-11-05 ENCOUNTER — Ambulatory Visit (INDEPENDENT_AMBULATORY_CARE_PROVIDER_SITE_OTHER): Payer: BLUE CROSS/BLUE SHIELD | Admitting: *Deleted

## 2017-11-05 DIAGNOSIS — Z3042 Encounter for surveillance of injectable contraceptive: Secondary | ICD-10-CM | POA: Diagnosis not present

## 2017-11-05 NOTE — Progress Notes (Signed)
Pt given Medroxyprogesterone inj Tolerated well Urine pregnancy not done Sp[facility] with Krist[facility] at [facility]

## 2017-11-18 ENCOUNTER — Encounter: Payer: Self-pay | Admitting: Physician Assistant

## 2017-11-18 ENCOUNTER — Ambulatory Visit: Payer: BLUE CROSS/BLUE SHIELD | Admitting: Physician Assistant

## 2017-11-18 VITALS — BP 99/60 | HR 75 | Temp 98.7°F | Ht 62.0 in | Wt 281.0 lb

## 2017-11-18 DIAGNOSIS — G43909 Migraine, unspecified, not intractable, without status migrainosus: Secondary | ICD-10-CM

## 2017-11-18 MED ORDER — VENLAFAXINE HCL ER 37.5 MG PO CP24: 37.5000 mg | ORAL_CAPSULE | Freq: Every day | ORAL | 5 refills | Status: DC

## 2017-11-18 MED ORDER — RIZATRIPTAN BENZOATE 10 MG PO TABS: 10.0000 mg | ORAL_TABLET | ORAL | 11 refills | Status: DC | PRN

## 2017-11-18 NOTE — Patient Instructions (Signed)
In a few days you may receive a survey in the mail or online from [employer] regarding your visit with us today. Please take a moment to fill this out. Your feedback is very important to our whole office. It can help us better understand your needs as well as improve your experience and satisfaction. Thank you for taking your time to complete it. We care about you.  Amire Gossen, PA-C  

## 2017-11-20 NOTE — Progress Notes (Signed)
BP 99/60   Pulse 75   Temp 98.7 F (37.1 C) (Oral)   Ht 5\' 2"  (1.575 m)   Wt 281 lb (127.5 kg)   BMI 51.40 kg/m    Subjective:    Patient ID: Lauren Green, female    DOB: [birth year], 32 y.o.   MRN: [id number]  HPI: Lauren Green is a 32 y.o. female presenting on 11/18/2017 for Medication Refill  This patient comes in for periodic recheck on medications and conditions including migraine, chronic. She has been having worse and more of them.  The medications have not ben helping. She did not tolerate Topamax either.   All medications are reviewed today. There are no reports of any problems with the medications. All of the medical conditions are reviewed and updated.  Lab work is reviewed and will be ordered as medically necessary. There are no new problems reported with today's visit.   Past Medical History:  Diagnosis Date  . Heart murmur   . Migraine    Relevant past medical, surgical, family and social history reviewed and updated as indicated. Interim medical history since our last visit reviewed. Allergies and medications reviewed and updated. DATA REVIEWED: CHART IN EPIC  Family History reviewed for pertinent findings.  Review of Systems  Constitutional: Negative.  Negative for activity change, fatigue and fever.  HENT: Negative.   Eyes: Negative.   Respiratory: Negative.  Negative for cough.   Cardiovascular: Negative.  Negative for chest pain.  Gastrointestinal: Negative.  Negative for abdominal pain.  Endocrine: Negative.   Genitourinary: Negative.  Negative for dysuria.  Musculoskeletal: Negative.   Skin: Negative.   Neurological: Negative.     Allergies as of 11/18/2017      Reactions   Penicillins       Medication List        Accurate as of 11/18/17 11:59 PM. Always use your most recent med list.          acetaminophen 500 MG tablet Commonly known as:  TYLENOL Take 1,000 mg by mouth every 6 (six) hours as needed for pain.     medroxyPROGESTERone 150 MG/ML injection Commonly known as:  DEPO-PROVERA Inject 1 mL (150 mg total) into the muscle every 3 (three) months.   rizatriptan 10 MG tablet Commonly known as:  MAXALT Take 1 tablet (10 mg total) by mouth as needed.   venlafaxine XR 37.5 MG 24 hr capsule Commonly known as:  EFFEXOR-XR Take 1-2 capsules (37.5-75 mg total) by mouth daily with breakfast.          Objective:    BP 99/60   Pulse 75   Temp 98.7 F (37.1 C) (Oral)   Ht 5\' 2"  (1.575 m)   Wt 281 lb (127.5 kg)   BMI 51.40 kg/m   Allergies  Allergen Reactions  . Penicillins     Wt Readings from Last 3 Encounters:  11/18/17 281 lb (127.5 kg)  11/20/16 257 lb 6 oz (116.7 kg)  06/11/16 249 lb (112.9 kg)    Physical Exam  Constitutional: She is oriented to person, place, and time. She appears well-developed and well-nourished.  HENT:  Head: Normocephalic and atraumatic.  Eyes: Conjunctivae and EOM are normal. Pupils are equal, round, and reactive to light.  Cardiovascular: Normal rate, regular rhythm, normal heart sounds and intact distal pulses.  Pulmonary/Chest: Effort normal and breath sounds normal.  Abdominal: Soft. Bowel sounds are normal.  Neurological: She is alert and oriented to person, place, and time.  She has normal reflexes.  Skin: Skin is warm and dry. No rash noted.  Psychiatric: She has a normal mood and affect. Her behavior is normal. Judgment and thought content normal.  Nursing note and vitals reviewed.       Assessment & Plan:   1. Migraine without status migrainosus, not intractable, unspecified migraine type - venlafaxine XR (EFFEXOR-XR) 37.5 MG 24 hr capsule; Take 1-2 capsules (37.5-75 mg total) by mouth daily with breakfast.  Dispense: 60 capsule; Refill: 5 - rizatriptan (MAXALT) 10 MG tablet; Take 1 tablet (10 mg total) by mouth as needed.  Dispense: 10 tablet; Refill: 11   Continue all other maintenance medications as listed above.  Follow up  plan: Return in about 6 months (around 05/21/2018) for recheck.  Educational handout given for survey  Remus LofflerAngel S. Tzippy Testerman PA-C [facility] [address]  [place], [place] [id number] [phone]   11/20/2017, 10:36 PM

## 2018-01-23 ENCOUNTER — Ambulatory Visit: Payer: BLUE CROSS/BLUE SHIELD

## 2018-01-30 ENCOUNTER — Ambulatory Visit (INDEPENDENT_AMBULATORY_CARE_PROVIDER_SITE_OTHER): Payer: BLUE CROSS/BLUE SHIELD | Admitting: *Deleted

## 2018-01-30 DIAGNOSIS — Z3042 Encounter for surveillance of injectable contraceptive: Secondary | ICD-10-CM | POA: Diagnosis not present

## 2018-01-30 NOTE — Patient Instructions (Signed)

## 2018-01-30 NOTE — Progress Notes (Signed)
Depoprovera given and patient tolerated well.  

## 2018-03-17 ENCOUNTER — Encounter: Payer: BLUE CROSS/BLUE SHIELD | Admitting: Physician Assistant

## 2018-04-02 ENCOUNTER — Ambulatory Visit: Payer: BLUE CROSS/BLUE SHIELD | Admitting: Family Medicine

## 2018-04-02 ENCOUNTER — Encounter: Payer: Self-pay | Admitting: Family Medicine

## 2018-04-02 VITALS — BP 101/64 | HR 88 | Temp 99.3°F | Ht 62.0 in | Wt 286.0 lb

## 2018-04-02 DIAGNOSIS — J0110 Acute frontal sinusitis, unspecified: Secondary | ICD-10-CM | POA: Diagnosis not present

## 2018-04-02 MED ORDER — AZITHROMYCIN 250 MG PO TABS: ORAL_TABLET | ORAL | 0 refills | Status: DC

## 2018-04-02 MED ORDER — BENZONATATE 100 MG PO CAPS: 100.0000 mg | ORAL_CAPSULE | Freq: Three times a day (TID) | ORAL | 0 refills | Status: DC | PRN

## 2018-04-02 NOTE — Patient Instructions (Signed)
-   Get plenty of rest and drink plenty of fluids. - Try to breathe moist air. Use a cold mist humidifier. - Consume warm fluids (soup or tea) to provide relief for a stuffy nose and to loosen phlegm. - For nasal stuffiness, try saline nasal spray or a Neti Pot. Afrin nasal spray can also be used but this product should not be used longer than 3 days or it will cause rebound nasal stuffiness (worsening nasal congestion). - For sore throat pain relief: use chloraseptic spray, suck on throat lozenges, hard candy or popsicles; gargle with warm salt water (1/4 tsp. salt per 8 oz. of water); and eat soft, bland foods. - Eat a well-balanced diet. If you cannot, ensure you are getting enough nutrients by taking a daily multivitamin. - Avoid dairy products, as they can thicken phlegm. - Avoid alcohol, as it impairs your body's immune system.   Sinusitis, Adult Sinusitis is soreness and inflammation of your sinuses. Sinuses are hollow spaces in the bones around your face. They are located:  Around your eyes.  In the middle of your forehead.  Behind your nose.  In your cheekbones.  Your sinuses and nasal passages are lined with a stringy fluid (mucus). Mucus normally drains out of your sinuses. When your nasal tissues get inflamed or swollen, the mucus can get trapped or blocked so air cannot flow through your sinuses. This lets bacteria, viruses, and funguses grow, and that leads to infection. Follow these instructions at home: Medicines  Take, use, or apply over-the-counter and prescription medicines only as told by your doctor. These may include nasal sprays.  If you were prescribed an antibiotic medicine, take it as told by your doctor. Do not stop taking the antibiotic even if you start to feel better. Hydrate and Humidify  Drink enough water to keep your pee (urine) clear or pale yellow.  Use a cool mist humidifier to keep the humidity level in your home above 50%.  Breathe in steam for  10-15 minutes, 3-4 times a day or as told by your doctor. You can do this in the bathroom while a hot shower is running.  Try not to spend time in cool or dry air. Rest  Rest as much as possible.  Sleep with your head raised (elevated).  Make sure to get enough sleep each night. General instructions  Put a warm, moist washcloth on your face 3-4 times a day or as told by your doctor. This will help with discomfort.  Wash your hands often with soap and water. If there is no soap and water, use hand sanitizer.  Do not smoke. Avoid being around people who are smoking (secondhand smoke).  Keep all follow-up visits as told by your doctor. This is important. Contact a doctor if:  You have a fever.  Your symptoms get worse.  Your symptoms do not get better within 10 days. Get help right away if:  You have a very bad headache.  You cannot stop throwing up (vomiting).  You have pain or swelling around your face or eyes.  You have trouble seeing.  You feel confused.  Your neck is stiff.  You have trouble breathing. This information is not intended to replace advice given to you by your health care provider. Make sure you discuss any questions you have with your health care provider. Document Released: 02/12/2008 Document Revised: 04/21/2016 Document Reviewed: 06/21/2015 Elsevier Interactive Patient Education  [employer]2018 Elsevier Inc.

## 2018-04-02 NOTE — Progress Notes (Signed)
Subjective: CC: Sinus PCP: Lauren Loffler, PA-C [id number] L Lauren Green is a 32 y.o. female presenting to clinic today for:  1. Sinuses Patient reports a one-week history of sinus drainage, productive cough, sinus and chest congestion and headache.  She states that the symptoms seem to get better but then got worse again.  Symptoms are worse at nighttime.  She reports a temperature to 100 F this morning.  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  No hemoptysis, wheeze.  She occasionally has shortness of breath with coughing spells but otherwise has been breathing normally.  Symptoms are refractory to over-the-counter Tylenol Cold and flu and other OTC sinus medications.   ROS: Per HPI  Allergies  Allergen Reactions  . Penicillins    Past Medical History:  Diagnosis Date  . Heart murmur   . Migraine     Current Outpatient Medications:  .  acetaminophen (TYLENOL) 500 MG tablet, Take 1,000 mg by mouth every 6 (six) hours as needed for pain., Disp: , Rfl:  .  medroxyPROGESTERone (DEPO-PROVERA) 150 MG/ML injection, Inject 1 mL (150 mg total) into the muscle every 3 (three) months., Disp: 1 mL, Rfl: 3 .  rizatriptan (MAXALT) 10 MG tablet, Take 1 tablet (10 mg total) by mouth as needed., Disp: 10 tablet, Rfl: 11 .  venlafaxine XR (EFFEXOR-XR) 37.5 MG 24 hr capsule, Take 1-2 capsules (37.5-75 mg total) by mouth daily with breakfast., Disp: 60 capsule, Rfl: 5  Current Facility-Administered Medications:  .  medroxyPROGESTERone (DEPO-PROVERA) injection 150 mg, 150 mg, Intramuscular, Q90 days, Lauren Green, Lauren S, PA-C, 150 mg at 01/30/18 1616 Social History   Socioeconomic History  . Marital status: Single    Spouse name: Not on file  . Number of children: Not on file  . Years of education: Not on file  . Highest education level: Not on file  Occupational History  . Not on file  Social Needs  . Financial resource strain: Not on file  . Food  insecurity:    Worry: Not on file    Inability: Not on file  . Transportation needs:    Medical: Not on file    Non-medical: Not on file  Tobacco Use  . Smoking status: Current Every Day Smoker    Packs/day: 0.50    Types: Cigarettes  . Smokeless tobacco: Never Used  Substance and Sexual Activity  . Alcohol use: Yes    Comment: occ  . Drug use: No  . Sexual activity: Yes    Birth control/protection: Injection  Lifestyle  . Physical activity:    Days per week: Not on file    Minutes per session: Not on file  . Stress: Not on file  Relationships  . Social connections:    Talks on phone: Not on file    Gets together: Not on file    Attends religious service: Not on file    Active member of club or organization: Not on file    Attends meetings of clubs or organizations: Not on file    Relationship status: Not on file  . Intimate partner violence:    Fear of current or ex partner: Not on file    Emotionally abused: Not on file    Physically abused: Not on file    Forced sexual activity: Not on file  Other Topics Concern  . Not on file  Social History Narrative  . Not on file   Family History  Problem Relation Age of Onset  . Multiple sclerosis Mother   . Anesthesia problems Neg Hx   . Hypotension Neg Hx   . Malignant hyperthermia Neg Hx   . Pseudochol deficiency Neg Hx     Objective: Office vital signs reviewed. BP 101/64   Pulse 88   Temp 99.3 F (37.4 C) (Oral)   Ht 5\' 2"  (1.575 m)   Wt 286 lb (129.7 kg)   SpO2 97%   BMI 52.31 kg/m   Physical Examination:  General: Awake, alert, well nourished, nontoxic, No acute distress HEENT: Normal    Neck: No masses palpated. No lymphadenopathy    Ears: Tympanic membranes intact, normal light reflex, no erythema, no bulging    Eyes: PERRLA, extraocular membranes intact, sclera white    Nose: nasal turbinates moist, opaque nasal discharge    Throat: moist mucus membranes, no erythema, tonsillars technically  difficult to visualize.  Airway is patent Cardio: regular rate and rhythm, S1S2 heard, no murmurs appreciated Pulm: clear to auscultation bilaterally, no wheezes, rhonchi or rales; normal work of breathing on room air  Assessment/ Plan: 32 y.o. female   1. Acute non-recurrent frontal sinusitis Patient with low-grade fever to 99.3 F here in office.  She has normal work of breathing and oxygen saturation on room air.  Given waxing and waning of symptoms with predominance of severity at nighttime, we will empirically treat for an acute bacterial sinusitis.  Z-Pak was prescribed to cover for any possible bronchitis as well.  Tessalon Perles prescribed for cough.  May continue OTC remedies if she finds them helpful.  Push oral fluids.  Work note provided excusing through Monday.  Reasons for return and emergent evaluation emergency department discussed.  Patient will follow-up as needed.  Smoking cessation encouraged.  Meds ordered this encounter  Medications  . azithromycin (ZITHROMAX Z-PAK) 250 MG tablet    Sig: As directed    Dispense:  6 tablet    Refill:  0  . benzonatate (TESSALON PERLES) 100 MG capsule    Sig: Take 1 capsule (100 mg total) by mouth 3 (three) times daily as needed for cough.    Dispense:  20 capsule    Refill:  0     Lauren Green SkainsM Lauren Nannini, DO [facility] [phone]

## 2018-04-20 ENCOUNTER — Ambulatory Visit: Payer: BLUE CROSS/BLUE SHIELD

## 2018-04-23 ENCOUNTER — Ambulatory Visit: Payer: BLUE CROSS/BLUE SHIELD

## 2018-05-08 ENCOUNTER — Ambulatory Visit (INDEPENDENT_AMBULATORY_CARE_PROVIDER_SITE_OTHER): Payer: BLUE CROSS/BLUE SHIELD | Admitting: *Deleted

## 2018-05-08 DIAGNOSIS — Z3042 Encounter for surveillance of injectable contraceptive: Secondary | ICD-10-CM | POA: Diagnosis not present

## 2018-05-08 LAB — PREGNANCY, URINE: Preg Test, Ur: NEGATIVE

## 2018-05-08 NOTE — Progress Notes (Signed)
Pt given Medroxyprogesterone inj Tolerated well 

## 2018-07-30 ENCOUNTER — Ambulatory Visit: Payer: BLUE CROSS/BLUE SHIELD

## 2018-08-03 ENCOUNTER — Ambulatory Visit (INDEPENDENT_AMBULATORY_CARE_PROVIDER_SITE_OTHER): Payer: BLUE CROSS/BLUE SHIELD

## 2018-08-03 DIAGNOSIS — Z3042 Encounter for surveillance of injectable contraceptive: Secondary | ICD-10-CM | POA: Diagnosis not present

## 2018-08-03 NOTE — Progress Notes (Signed)
Depo-Provera injection given to right upper outer quadrant.  Patient tolerated well.

## 2018-09-26 ENCOUNTER — Encounter: Payer: Self-pay | Admitting: Family Medicine

## 2018-09-26 ENCOUNTER — Ambulatory Visit (INDEPENDENT_AMBULATORY_CARE_PROVIDER_SITE_OTHER): Payer: BLUE CROSS/BLUE SHIELD | Admitting: Family Medicine

## 2018-09-26 VITALS — BP 114/77 | HR 84 | Temp 99.0°F | Wt 278.8 lb

## 2018-09-26 DIAGNOSIS — J0101 Acute recurrent maxillary sinusitis: Secondary | ICD-10-CM | POA: Diagnosis not present

## 2018-09-26 MED ORDER — FLUTICASONE PROPIONATE 50 MCG/ACT NA SUSP: 2.0000 | Freq: Every day | NASAL | 6 refills | Status: DC

## 2018-09-26 MED ORDER — CETIRIZINE HCL 10 MG PO TABS: 10.0000 mg | ORAL_TABLET | Freq: Every day | ORAL | 11 refills | Status: DC

## 2018-09-26 MED ORDER — CEFDINIR 300 MG PO CAPS: 300.0000 mg | ORAL_CAPSULE | Freq: Two times a day (BID) | ORAL | 0 refills | Status: AC

## 2018-09-26 NOTE — Patient Instructions (Signed)

## 2018-09-26 NOTE — Progress Notes (Signed)
    Subjective:     Lauren Green is a 33 y.o. female who presents for evaluation of sinus pain. Symptoms include: congestion, cough, facial pain, fevers, frequent clearing of the throat, mouth breathing, nasal congestion, post nasal drip, puffiness of the eyes, purulent rhinorrhea and sore throat. Onset of symptoms was several days ago. Symptoms have been gradually worsening since that time. Past history is significant for no history of pneumonia or bronchitis. Patient is a smoker  (0.5 ppd).  The following portions of the patient's history were reviewed and updated as appropriate: allergies, current medications, past family history, past medical history, past social history, past surgical history and problem list.  Review of Systems Constitutional: positive for chills, fatigue and fevers Eyes: positive for puffiness Ears, nose, mouth, throat, and face: positive for earaches, hoarseness, nasal congestion, sore throat and sinus pressure and drainage Respiratory: positive for cough Cardiovascular: negative Musculoskeletal:negative Neurological: positive for headaches   Objective:    BP 114/77   Pulse 84   Temp 99 F (37.2 C) (Oral)   Wt 278 lb 12.8 oz (126.5 kg)   BMI 50.99 kg/m  General appearance: alert, cooperative and moderate distress Head: Normocephalic, without obvious abnormality, atraumatic Eyes: negative Ears: abnormal TM right ear - serous middle ear fluid and abnormal TM left ear - serous middle ear fluid Nose: scant and yellow discharge, moderate congestion, turbinates red, swollen, moderate maxillary sinus tenderness bilateral Throat: abnormal findings: mild oropharyngeal erythema and postnasal drainage Neck: no adenopathy, no carotid bruit, no JVD, supple, symmetrical, trachea midline and thyroid not enlarged, symmetric, no tenderness/mass/nodules Lungs: clear to auscultation bilaterally Heart: regular rate and rhythm, S1, S2 normal, no murmur, click, rub or  gallop Lymph nodes: Cervical, supraclavicular, and axillary nodes normal. Neurologic: Grossly normal    Assessment:   [name] was seen today for sinusitis.  Diagnoses and all orders for this visit:  Acute recurrent maxillary sinusitis -     cefdinir (OMNICEF) 300 MG capsule; Take 1 capsule (300 mg total) by mouth 2 (two) times daily for 7 days. 1 po BID -     fluticasone (FLONASE) 50 MCG/ACT nasal spray; Place 2 sprays into both nostrils daily. -     cetirizine (ZYRTEC) 10 MG tablet; Take 1 tablet (10 mg total) by mouth daily.     Plan:    Nasal saline sprays. Nasal steroids per medication orders. Antihistamines per medication orders. Omnicef per medication orders. Follow up in 5 days if not feeling better or as needed.    The above assessment and management plan was discussed with the patient. The patient verbalized understanding of and has agreed to the management plan. Patient is aware to call the clinic if symptoms fail to improve or worsen. Patient is aware when to return to the clinic for a follow-up visit. Patient educated on when it is appropriate to go to the emergency department.   Kari Baars, FNP-C [facility] [address] [place], [place] [address] [phone]

## 2018-10-06 ENCOUNTER — Ambulatory Visit (INDEPENDENT_AMBULATORY_CARE_PROVIDER_SITE_OTHER): Payer: BLUE CROSS/BLUE SHIELD | Admitting: Physician Assistant

## 2018-10-06 ENCOUNTER — Encounter: Payer: Self-pay | Admitting: Physician Assistant

## 2018-10-06 VITALS — BP 112/73 | HR 82 | Temp 98.6°F | Ht 62.0 in | Wt 287.0 lb

## 2018-10-06 DIAGNOSIS — Z3042 Encounter for surveillance of injectable contraceptive: Secondary | ICD-10-CM

## 2018-10-06 DIAGNOSIS — Z0000 Encounter for general adult medical examination without abnormal findings: Secondary | ICD-10-CM

## 2018-10-06 DIAGNOSIS — Z111 Encounter for screening for respiratory tuberculosis: Secondary | ICD-10-CM | POA: Diagnosis not present

## 2018-10-06 DIAGNOSIS — Z23 Encounter for immunization: Secondary | ICD-10-CM | POA: Diagnosis not present

## 2018-10-06 DIAGNOSIS — G43909 Migraine, unspecified, not intractable, without status migrainosus: Secondary | ICD-10-CM | POA: Diagnosis not present

## 2018-10-06 DIAGNOSIS — Z0001 Encounter for general adult medical examination with abnormal findings: Secondary | ICD-10-CM

## 2018-10-06 MED ORDER — MEDROXYPROGESTERONE ACETATE 150 MG/ML IM SUSP: 150.0000 mg | INTRAMUSCULAR | 3 refills | Status: DC

## 2018-10-06 MED ORDER — RIZATRIPTAN BENZOATE 10 MG PO TABS: 10.0000 mg | ORAL_TABLET | ORAL | 11 refills | Status: DC | PRN

## 2018-10-06 MED ORDER — VENLAFAXINE HCL ER 37.5 MG PO CP24: 37.5000 mg | ORAL_CAPSULE | Freq: Every day | ORAL | 11 refills | Status: DC

## 2018-10-06 NOTE — Progress Notes (Signed)
BP 112/73   Pulse 82   Temp 98.6 F (37 C) (Oral)   Ht '5\' 2"'$  (1.575 m)   Wt 287 lb (130.2 kg)   BMI 52.49 kg/m    Subjective:    Patient ID: Lauren Green, female    DOB: May 04, 1986, 33 y.o.   MRN: [id number]  HPI: Lauren Green is a 33 y.o. female presenting on 10/06/2018 for Annual Exam  Today this patient is here for an annual exam.  It is been a couple years since she has had a Pap smear performed.  She had previously been going to the [facility].  She had been continued on Depo-Medrol injection every 13 weeks through our office.  She is due upcoming Pap.  However she was wanting to try to have her tubes tied and to go off of the birth control pill altogether.  She has had a history of very severe periods in the past.  I encouraged her to let us refer her to gynecology to discuss the options for her future birth control and hormonal control.  She has seen Dr. Philis Pique in the past with [facility].  Her migraines are very well controlled at this time.  She just needs refills on that medication also.  And she will be coming in a few days for fasting annual labs.   Past Medical History:  Diagnosis Date  . Heart murmur   . Migraine    Relevant past medical, surgical, family and social history reviewed and updated as indicated. Interim medical history since our last visit reviewed. Allergies and medications reviewed and updated. DATA REVIEWED: CHART IN EPIC  Family History reviewed for pertinent findings.  Review of Systems  Constitutional: Negative.  Negative for activity change, fatigue and fever.  HENT: Negative.   Eyes: Negative.   Respiratory: Negative.  Negative for cough.   Cardiovascular: Negative.  Negative for chest pain.  Gastrointestinal: Negative.  Negative for abdominal pain.  Endocrine: Negative.   Genitourinary: Positive for menstrual problem. Negative for difficulty urinating and dysuria.  Musculoskeletal: Negative.     Skin: Negative.   Neurological: Positive for headaches.    Allergies as of 10/06/2018      Reactions   Penicillins       Medication List       Accurate as of October 06, 2018  2:47 PM. Always use your most recent med list.        acetaminophen 500 MG tablet Commonly known as:  TYLENOL Take 1,000 mg by mouth every 6 (six) hours as needed for pain.   cetirizine 10 MG tablet Commonly known as:  ZYRTEC Take 1 tablet (10 mg total) by mouth daily.   fluticasone 50 MCG/ACT nasal spray Commonly known as:  FLONASE Place 2 sprays into both nostrils daily.   medroxyPROGESTERone 150 MG/ML injection Commonly known as:  DEPO-PROVERA Inject 1 mL (150 mg total) into the muscle every 3 (three) months.   rizatriptan 10 MG tablet Commonly known as:  MAXALT Take 1 tablet (10 mg total) by mouth as needed.   venlafaxine XR 37.5 MG 24 hr capsule Commonly known as:  EFFEXOR-XR Take 1-2 capsules (37.5-75 mg total) by mouth daily with breakfast.          Objective:    BP 112/73   Pulse 82   Temp 98.6 F (37 C) (Oral)   Ht '5\' 2"'$  (1.575 m)   Wt 287 lb (130.2 kg)   BMI  52.49 kg/m   Allergies  Allergen Reactions  . Penicillins     Wt Readings from Last 3 Encounters:  10/06/18 287 lb (130.2 kg)  09/26/18 278 lb 12.8 oz (126.5 kg)  04/02/18 286 lb (129.7 kg)    Physical Exam Constitutional:      Appearance: She is well-developed.  HENT:     Head: Normocephalic and atraumatic.     Right Ear: Tympanic membrane, ear canal and external ear normal.     Left Ear: Tympanic membrane, ear canal and external ear normal.     Nose: Nose normal. No rhinorrhea.     Mouth/Throat:     Pharynx: No oropharyngeal exudate or posterior oropharyngeal erythema.  Eyes:     Conjunctiva/sclera: Conjunctivae normal.     Pupils: Pupils are equal, round, and reactive to light.  Neck:     Musculoskeletal: Normal range of motion and neck supple.  Cardiovascular:     Rate and Rhythm: Normal rate and  regular rhythm.     Heart sounds: Normal heart sounds.  Pulmonary:     Effort: Pulmonary effort is normal.     Breath sounds: Normal breath sounds.  Abdominal:     General: Bowel sounds are normal.     Palpations: Abdomen is soft.  Skin:    General: Skin is warm and dry.     Findings: No rash.  Neurological:     Mental Status: She is alert and oriented to person, place, and time.     Deep Tendon Reflexes: Reflexes are normal and symmetric.  Psychiatric:        Behavior: Behavior normal.        Thought Content: Thought content normal.        Judgment: Judgment normal.     Results for orders placed or performed in visit on 05/08/18  Pregnancy, urine  Result Value Ref Range   Preg Test, Ur Negative Negative      Assessment & Plan:   1. Well adult exam - CBC with Differential/Platelet; Future - CMP14+EGFR; Future - Lipid panel; Future - TSH; Future  2. PPD screening test - PPD  3. Migraine without status migrainosus, not intractable, unspecified migraine type - rizatriptan (MAXALT) 10 MG tablet; Take 1 tablet (10 mg total) by mouth as needed.  Dispense: 10 tablet; Refill: 11 - venlafaxine XR (EFFEXOR-XR) 37.5 MG 24 hr capsule; Take 1-2 capsules (37.5-75 mg total) by mouth daily with breakfast.  Dispense: 60 capsule; Refill: 11  4. Encounter for surveillance of injectable contraceptive - medroxyPROGESTERone (DEPO-PROVERA) 150 MG/ML injection; Inject 1 mL (150 mg total) into the muscle every 3 (three) months.  Dispense: 1 mL; Refill: 3 Referral to gynecology  Continue all other maintenance medications as listed above.  Follow up plan: No follow-ups on file.  Educational handout given for [place] PA-C [facility] [address]  [place], [place] [address] [phone]   10/06/2018, 2:47 PM

## 2018-10-08 ENCOUNTER — Other Ambulatory Visit: Payer: BLUE CROSS/BLUE SHIELD

## 2018-10-08 DIAGNOSIS — Z0000 Encounter for general adult medical examination without abnormal findings: Secondary | ICD-10-CM

## 2018-10-08 LAB — TB SKIN TEST
Induration: 0 mm
TB SKIN TEST: NEGATIVE

## 2018-10-09 LAB — CMP14+EGFR
ALT: 23 IU/L (ref 0–32)
AST: 19 IU/L (ref 0–40)
Albumin/Globulin Ratio: 1.5 (ref 1.2–2.2)
Albumin: 4 g/dL (ref 3.8–4.8)
Alkaline Phosphatase: 71 IU/L (ref 39–117)
BUN / CREAT RATIO: 11 (ref 9–23)
BUN: 11 mg/dL (ref 6–20)
Bilirubin Total: 0.2 mg/dL (ref 0.0–1.2)
CO2: 20 mmol/L (ref 20–29)
Calcium: 9 mg/dL (ref 8.7–10.2)
Chloride: 106 mmol/L (ref 96–106)
Creatinine, Ser: 1.01 mg/dL — ABNORMAL HIGH (ref 0.57–1.00)
GFR calc Af Amer: 85 mL/min/{1.73_m2} (ref >59)
GFR calc non Af Amer: 73 mL/min/{1.73_m2} (ref >59)
Globulin, Total: 2.6 g/dL (ref 1.5–4.5)
Glucose: 84 mg/dL (ref 65–99)
Potassium: 4.1 mmol/L (ref 3.5–5.2)
Sodium: 141 mmol/L (ref 134–144)
Total Protein: 6.6 g/dL (ref 6.0–8.5)

## 2018-10-09 LAB — CBC WITH DIFFERENTIAL/PLATELET
Basophils Absolute: 0 10*3/uL (ref 0.0–0.2)
Basos: 1 %
EOS (ABSOLUTE): 0.1 10*3/uL (ref 0.0–0.4)
Eos: 2 %
Hematocrit: 36 % (ref 34.0–46.6)
Hemoglobin: 11.9 g/dL (ref 11.1–15.9)
Immature Grans (Abs): 0 10*3/uL (ref 0.0–0.1)
Immature Granulocytes: 0 %
Lymphocytes Absolute: 1.7 10*3/uL (ref 0.7–3.1)
Lymphs: 37 %
MCH: 29.2 pg (ref 26.6–33.0)
MCHC: 33.1 g/dL (ref 31.5–35.7)
MCV: 88 fL (ref 79–97)
Monocytes Absolute: 0.5 10*3/uL (ref 0.1–0.9)
Monocytes: 10 %
Neutrophils Absolute: 2.3 10*3/uL (ref 1.4–7.0)
Neutrophils: 50 %
Platelets: 344 10*3/uL (ref 150–450)
RBC: 4.08 x10E6/uL (ref 3.77–5.28)
RDW: 12.9 % (ref 11.7–15.4)
WBC: 4.7 10*3/uL (ref 3.4–10.8)

## 2018-10-09 LAB — LIPID PANEL
Chol/HDL Ratio: 3.5 ratio (ref 0.0–4.4)
Cholesterol, Total: 169 mg/dL (ref 100–199)
HDL: 48 mg/dL (ref >39)
LDL Calculated: 112 mg/dL — ABNORMAL HIGH (ref 0–99)
Triglycerides: 45 mg/dL (ref 0–149)
VLDL Cholesterol Cal: 9 mg/dL (ref 5–40)

## 2018-10-09 LAB — TSH: TSH: 0.338 u[IU]/mL — ABNORMAL LOW (ref 0.450–4.500)

## 2018-10-30 ENCOUNTER — Ambulatory Visit (INDEPENDENT_AMBULATORY_CARE_PROVIDER_SITE_OTHER): Payer: BLUE CROSS/BLUE SHIELD | Admitting: *Deleted

## 2018-10-30 DIAGNOSIS — Z3042 Encounter for surveillance of injectable contraceptive: Secondary | ICD-10-CM | POA: Diagnosis not present

## 2018-10-30 NOTE — Progress Notes (Signed)
Pt given Medroxyprogesterone inj Tolerated well 

## 2019-01-21 ENCOUNTER — Other Ambulatory Visit: Payer: Self-pay

## 2019-01-22 ENCOUNTER — Ambulatory Visit (INDEPENDENT_AMBULATORY_CARE_PROVIDER_SITE_OTHER): Payer: BLUE CROSS/BLUE SHIELD | Admitting: *Deleted

## 2019-01-22 DIAGNOSIS — Z3042 Encounter for surveillance of injectable contraceptive: Secondary | ICD-10-CM | POA: Diagnosis not present

## 2019-01-22 MED ORDER — MEDROXYPROGESTERONE ACETATE 150 MG/ML IM SUSP
150.0000 mg | Freq: Once | INTRAMUSCULAR | Status: AC
  Administered 2019-01-22: 150 mg via INTRAMUSCULAR

## 2019-03-19 ENCOUNTER — Encounter: Payer: Self-pay | Admitting: Family Medicine

## 2019-03-19 ENCOUNTER — Other Ambulatory Visit: Payer: Self-pay

## 2019-03-19 ENCOUNTER — Ambulatory Visit (INDEPENDENT_AMBULATORY_CARE_PROVIDER_SITE_OTHER): Payer: BC Managed Care – PPO | Admitting: Family Medicine

## 2019-03-19 ENCOUNTER — Telehealth: Payer: Self-pay | Admitting: *Deleted

## 2019-03-19 VITALS — Temp 98.3°F

## 2019-03-19 DIAGNOSIS — R112 Nausea with vomiting, unspecified: Secondary | ICD-10-CM

## 2019-03-19 DIAGNOSIS — R6889 Other general symptoms and signs: Secondary | ICD-10-CM | POA: Diagnosis not present

## 2019-03-19 DIAGNOSIS — Z20828 Contact with and (suspected) exposure to other viral communicable diseases: Secondary | ICD-10-CM | POA: Diagnosis not present

## 2019-03-19 DIAGNOSIS — Z20822 Contact with and (suspected) exposure to covid-19: Secondary | ICD-10-CM

## 2019-03-19 MED ORDER — ONDANSETRON HCL 4 MG PO TABS: 4.0000 mg | ORAL_TABLET | Freq: Three times a day (TID) | ORAL | 0 refills | Status: DC | PRN

## 2019-03-19 NOTE — Progress Notes (Signed)
Virtual Visit via Telephone Note  I connected with Leone Brand on 03/19/19 at 9:18 AM by telephone and verified that I am speaking with the correct person using two identifiers. SHITAL CRAYTON is currently located at home and her boyfriend is currently with her during this visit, which she is okay with. The provider, Loman Brooklyn, FNP is located in their office at time of visit.  I discussed the limitations, risks, security and privacy concerns of performing an evaluation and management service by telephone and the availability of in person appointments. I also discussed with the patient that there may be a patient responsible charge related to this service. The patient expressed understanding and agreed to proceed.  Subjective: PCP: Terald Sleeper, PA-C  Chief Complaint  Patient presents with  . Vomiting  . Nausea  . Diarrhea  . Fever  . Headache   Patient complains of headache, fever, nausea, vomiting and diarrhea. Max temp 101.3 yesterday. Onset of symptoms was 1 day ago, gradually worsening since that time. She states she was tested 10 days ago for COVID and the results were negative but then these symptoms started and she feels she needs to be tested again, as she does work at a [employer]. She is drinking plenty of fluids. Evaluation to date: none. She does not smoke. Patient has had recent close contact with someone who has tested positive for COVID-19.   ROS: Per HPI  Allergies  Allergen Reactions  . Penicillins    Past Medical History:  Diagnosis Date  . Heart murmur   . Migraine     Current Outpatient Medications:  .  acetaminophen (TYLENOL) 500 MG tablet, Take 1,000 mg by mouth every 6 (six) hours as needed for pain., Disp: , Rfl:  .  cetirizine (ZYRTEC) 10 MG tablet, Take 1 tablet (10 mg total) by mouth daily., Disp: 30 tablet, Rfl: 11 .  fluticasone (FLONASE) 50 MCG/ACT nasal spray, Place 2 sprays into both nostrils daily., Disp: 16 g, Rfl: 6 .   medroxyPROGESTERone (DEPO-PROVERA) 150 MG/ML injection, Inject 1 mL (150 mg total) into the muscle every 3 (three) months., Disp: 1 mL, Rfl: 3 .  ondansetron (ZOFRAN) 4 MG tablet, Take 1 tablet (4 mg total) by mouth every 8 (eight) hours as needed for nausea or vomiting., Disp: 20 tablet, Rfl: 0 .  rizatriptan (MAXALT) 10 MG tablet, Take 1 tablet (10 mg total) by mouth as needed., Disp: 10 tablet, Rfl: 11 .  venlafaxine XR (EFFEXOR-XR) 37.5 MG 24 hr capsule, Take 1-2 capsules (37.5-75 mg total) by mouth daily with breakfast., Disp: 60 capsule, Rfl: 11  Current Facility-Administered Medications:  .  medroxyPROGESTERone (DEPO-PROVERA) injection 150 mg, 150 mg, Intramuscular, Q90 days, Jones, Angel S, PA-C, 150 mg at 10/30/18 1325   Observations/Objective: A&O  No respiratory distress or wheezing audible over the phone Mood, judgement, and thought processes all WNL  Assessment and Plan: 1. Suspected Covid-19 Virus Infection/Exposure to Covid-19 Virus - Message routed to [facility] to request COVID testing.  - Education provided on symptom management and COVID-19.  - Advised not to try to stop diarrhea but to stay well hydrated and use Zofran for N/V.   3. Nausea and vomiting in adult patient - ondansetron (ZOFRAN) 4 MG tablet; Take 1 tablet (4 mg total) by mouth every 8 (eight) hours as needed for nausea or vomiting.  Dispense: 20 tablet; Refill: 0   Follow Up Instructions:  I discussed the assessment and treatment  plan with the patient. The patient was provided an opportunity to ask questions and all were answered. The patient agreed with the plan and demonstrated an understanding of the instructions.   The patient was advised to call back or seek an in-person evaluation if the symptoms worsen or if the condition fails to improve as anticipated.  The above assessment and management plan was discussed with the patient. The patient verbalized understanding of and has agreed  to the management plan. Patient is aware to call the clinic if symptoms persist or worsen. Patient is aware when to return to the clinic for a follow-up visit. Patient educated on when it is appropriate to go to the emergency department.   Time call ended: 9:30 AM  I provided 12 minutes of non-face-to-face time during this encounter.  Deliah BostonBritney Crounse, MSN, APRN, FNP-C [facility] 03/19/19

## 2019-03-19 NOTE — Telephone Encounter (Signed)
Pt scheduled today at [facility] @ 2:00. Order placed and Instructions given.

## 2019-03-19 NOTE — Telephone Encounter (Signed)
-----   Message from Loman Brooklyn, [place] sent at 03/19/2019  9:32 AM EDT ----- Regarding: COVID Testing MRN: [id number] Patient needs COVID testing due to fever, N/V/D and headache. 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   Thank you, Hendricks Limes, MSN, APRN, FNP-C [facility]

## 2019-03-19 NOTE — Patient Instructions (Signed)
This information is directly available on the CDC website: https://www.cdc.gov/coronavirus/2019-ncov/if-you-are-sick/steps-when-sick.html    Source:CDC Reference to specific commercial products, manufacturers, companies, or trademarks does not constitute its endorsement or recommendation by the U.S. Government, Department of Health and Human Services, or Centers for Disease Control and Prevention.  

## 2019-03-25 LAB — NOVEL CORONAVIRUS, NAA: SARS-CoV-2, NAA: NOT DETECTED

## 2019-04-15 ENCOUNTER — Other Ambulatory Visit: Payer: Self-pay

## 2019-04-16 ENCOUNTER — Ambulatory Visit: Payer: BLUE CROSS/BLUE SHIELD

## 2019-05-03 ENCOUNTER — Other Ambulatory Visit: Payer: Self-pay

## 2019-05-03 ENCOUNTER — Ambulatory Visit (INDEPENDENT_AMBULATORY_CARE_PROVIDER_SITE_OTHER): Payer: BC Managed Care – PPO | Admitting: *Deleted

## 2019-05-03 DIAGNOSIS — Z3042 Encounter for surveillance of injectable contraceptive: Secondary | ICD-10-CM

## 2019-05-03 LAB — PREGNANCY, URINE: Preg Test, Ur: NEGATIVE

## 2019-05-03 NOTE — Progress Notes (Signed)
Pt given Medroxyprogesterone inj Tolerated well Urine pregnancy was negative

## 2019-07-08 ENCOUNTER — Other Ambulatory Visit: Payer: Self-pay

## 2019-07-08 DIAGNOSIS — Z20828 Contact with and (suspected) exposure to other viral communicable diseases: Secondary | ICD-10-CM | POA: Diagnosis not present

## 2019-07-08 DIAGNOSIS — Z20822 Contact with and (suspected) exposure to covid-19: Secondary | ICD-10-CM

## 2019-07-10 LAB — NOVEL CORONAVIRUS, NAA: SARS-CoV-2, NAA: NOT DETECTED

## 2019-07-27 ENCOUNTER — Other Ambulatory Visit: Payer: Self-pay

## 2019-07-28 ENCOUNTER — Ambulatory Visit (INDEPENDENT_AMBULATORY_CARE_PROVIDER_SITE_OTHER): Payer: Medicaid Other

## 2019-07-28 DIAGNOSIS — Z3042 Encounter for surveillance of injectable contraceptive: Secondary | ICD-10-CM | POA: Diagnosis not present

## 2019-07-28 NOTE — Progress Notes (Signed)
Medroxyprogesterone injection given to right upper outer quadrant.  Patient tolerated well. 

## 2019-10-01 ENCOUNTER — Telehealth: Payer: Self-pay | Admitting: Physician Assistant

## 2019-10-04 ENCOUNTER — Ambulatory Visit: Payer: Medicaid Other

## 2019-10-04 ENCOUNTER — Other Ambulatory Visit: Payer: Self-pay

## 2019-10-05 ENCOUNTER — Ambulatory Visit (INDEPENDENT_AMBULATORY_CARE_PROVIDER_SITE_OTHER): Payer: Medicaid Other | Admitting: *Deleted

## 2019-10-05 DIAGNOSIS — Z111 Encounter for screening for respiratory tuberculosis: Secondary | ICD-10-CM | POA: Diagnosis not present

## 2019-10-07 LAB — TB SKIN TEST
Induration: 0 mm
TB Skin Test: NEGATIVE

## 2019-10-13 ENCOUNTER — Ambulatory Visit: Payer: Medicaid Other

## 2019-10-16 ENCOUNTER — Other Ambulatory Visit: Payer: Self-pay | Admitting: Physician Assistant

## 2019-10-16 DIAGNOSIS — G43909 Migraine, unspecified, not intractable, without status migrainosus: Secondary | ICD-10-CM

## 2019-10-18 ENCOUNTER — Telehealth: Payer: Self-pay | Admitting: Physician Assistant

## 2019-10-18 DIAGNOSIS — G43909 Migraine, unspecified, not intractable, without status migrainosus: Secondary | ICD-10-CM

## 2019-10-18 MED ORDER — VENLAFAXINE HCL ER 37.5 MG PO CP24: 37.5000 mg | ORAL_CAPSULE | Freq: Every day | ORAL | 0 refills | Status: DC

## 2019-10-18 NOTE — Telephone Encounter (Signed)
Needs to be seen

## 2019-10-18 NOTE — Telephone Encounter (Signed)
NA 02/08 ac

## 2019-10-18 NOTE — Telephone Encounter (Signed)
What is the name of the medication? venlafaxine XR (EFFEXOR-XR) 37.5 MG 24 hr capsule    Have you contacted your pharmacy to request a refill? YES  Which pharmacy would you like this sent to? [facility], has appt next week with Lawanna Kobus, pt is out of the medication needs refill    Patient notified that their request is being sent to the clinical staff for review and that they should receive a call once it is complete. If they do not receive a call within 24 hours they can check with their pharmacy or our office.

## 2019-10-19 NOTE — Telephone Encounter (Signed)
Patient given refill appt 02/16

## 2019-10-26 ENCOUNTER — Ambulatory Visit (INDEPENDENT_AMBULATORY_CARE_PROVIDER_SITE_OTHER): Payer: Medicaid Other | Admitting: Physician Assistant

## 2019-10-26 ENCOUNTER — Encounter: Payer: Self-pay | Admitting: Physician Assistant

## 2019-10-26 DIAGNOSIS — J0101 Acute recurrent maxillary sinusitis: Secondary | ICD-10-CM | POA: Insufficient documentation

## 2019-10-26 DIAGNOSIS — F320 Major depressive disorder, single episode, mild: Secondary | ICD-10-CM

## 2019-10-26 DIAGNOSIS — Z3042 Encounter for surveillance of injectable contraceptive: Secondary | ICD-10-CM

## 2019-10-26 DIAGNOSIS — G43909 Migraine, unspecified, not intractable, without status migrainosus: Secondary | ICD-10-CM | POA: Diagnosis not present

## 2019-10-26 MED ORDER — RIZATRIPTAN BENZOATE 10 MG PO TABS: 10.0000 mg | ORAL_TABLET | ORAL | 11 refills | Status: DC | PRN

## 2019-10-26 MED ORDER — CETIRIZINE HCL 10 MG PO TABS: 10.0000 mg | ORAL_TABLET | Freq: Every day | ORAL | 11 refills | Status: DC

## 2019-10-26 MED ORDER — FLUTICASONE PROPIONATE 50 MCG/ACT NA SUSP: 2.0000 | Freq: Every day | NASAL | 6 refills | Status: DC

## 2019-10-26 MED ORDER — MEDROXYPROGESTERONE ACETATE 150 MG/ML IM SUSP: 150.0000 mg | INTRAMUSCULAR | 3 refills | Status: DC

## 2019-10-26 MED ORDER — VENLAFAXINE HCL ER 37.5 MG PO CP24: 37.5000 mg | ORAL_CAPSULE | Freq: Every day | ORAL | 11 refills | Status: DC

## 2019-10-26 NOTE — Progress Notes (Signed)
Telephone visit  Subjective: Lauren Green meds PCP: Lauren Sleeper, PA-C Lauren Green is a 34 y.o. female calls for telephone consult today. Patient provides verbal consent for consult held via phone.  Patient is identified with 2 separate identifiers.  At this time the entire area is on COVID-19 social distancing and stay home orders are in place.  Patient is of higher risk and therefore we are performing this by a virtual method.  Location of patient: home Location of provider: HOME Others present for call: no  Patient is here in follow-up for her Green medical conditions which do include depression, migraines, recurrent sinusitis, need for injectable birth control.  All of her medications are reviewed.  And she is up-to-date on everything.  We will have her come in for her annual labs in the near future.  She states that overall she has been doing very well not having any significant issues.  She is actually done very well with her migraines this year and had to take currently no ill effects.  But she will be due a refill and they are out of date.   ROS: Per HPI  Allergies  Allergen Reactions  . Penicillins    Past Medical History:  Diagnosis Date  . Heart murmur   . Migraine     Current Outpatient Medications:  .  acetaminophen (TYLENOL) 500 MG tablet, Take 1,000 mg by mouth every 6 (six) hours as needed for pain., Disp: , Rfl:  .  cetirizine (ZYRTEC) 10 MG tablet, Take 1 tablet (10 mg total) by mouth daily., Disp: 30 tablet, Rfl: 11 .  fluticasone (FLONASE) 50 MCG/ACT nasal spray, Place 2 sprays into both nostrils daily., Disp: 16 g, Rfl: 6 .  medroxyPROGESTERone (DEPO-PROVERA) 150 MG/ML injection, Inject 1 mL (150 mg total) into the muscle every 3 (three) months., Disp: 1 mL, Rfl: 3 .  ondansetron (ZOFRAN) 4 MG tablet, Take 1 tablet (4 mg total) by mouth every 8 (eight) hours as needed for nausea or vomiting., Disp: 20 tablet, Rfl: 0 .  rizatriptan  (MAXALT) 10 MG tablet, Take 1 tablet (10 mg total) by mouth as needed., Disp: 10 tablet, Rfl: 11 .  venlafaxine XR (EFFEXOR-XR) 37.5 MG 24 hr capsule, Take 1-2 capsules (37.5-75 mg total) by mouth daily with breakfast., Disp: 60 capsule, Rfl: 11  Current Facility-Administered Medications:  .  medroxyPROGESTERone (DEPO-PROVERA) injection 150 mg, 150 mg, Intramuscular, Q90 days, Particia Nearing S, PA-C, 150 mg at 07/28/19 1115  Assessment/ Plan: 34 y.o. female   1. Encounter for surveillance of injectable contraceptive - medroxyPROGESTERone (DEPO-PROVERA) 150 MG/ML injection; Inject 1 mL (150 mg total) into the muscle every 3 (three) months.  Dispense: 1 mL; Refill: 3  2. Migraine without status migrainosus, not intractable, unspecified migraine type - venlafaxine XR (EFFEXOR-XR) 37.5 MG 24 hr capsule; Take 1-2 capsules (37.5-75 mg total) by mouth daily with breakfast.  Dispense: 60 capsule; Refill: 11 - rizatriptan (MAXALT) 10 MG tablet; Take 1 tablet (10 mg total) by mouth as needed.  Dispense: 10 tablet; Refill: 11  3. Acute recurrent maxillary sinusitis - cetirizine (ZYRTEC) 10 MG tablet; Take 1 tablet (10 mg total) by mouth daily.  Dispense: 30 tablet; Refill: 11 - fluticasone (FLONASE) 50 MCG/ACT nasal spray; Place 2 sprays into both nostrils daily.  Dispense: 16 g; Refill: 6  4. Depression, major, single episode, mild ([place])   No follow-ups on file.  Continue all other maintenance medications as listed above.  Start  time: 8:15 am End time: 8:26 AM  Meds ordered this encounter  Medications  . medroxyPROGESTERone (DEPO-PROVERA) 150 MG/ML injection    Sig: Inject 1 mL (150 mg total) into the muscle every 3 (three) months.    Dispense:  1 mL    Refill:  3    Order Specific Question:   Supervising Provider    Answer:   Raliegh Ip [[id number]]  . venlafaxine XR (EFFEXOR-XR) 37.5 MG 24 hr capsule    Sig: Take 1-2 capsules (37.5-75 mg total) by mouth daily with breakfast.     Dispense:  60 capsule    Refill:  11    Order Specific Question:   Supervising Provider    Answer:   Raliegh Ip [[id number]]  . rizatriptan (MAXALT) 10 MG tablet    Sig: Take 1 tablet (10 mg total) by mouth as needed.    Dispense:  10 tablet    Refill:  11    Order Specific Question:   Supervising Provider    Answer:   Raliegh Ip [[id number]]  . cetirizine (ZYRTEC) 10 MG tablet    Sig: Take 1 tablet (10 mg total) by mouth daily.    Dispense:  30 tablet    Refill:  11    Order Specific Question:   Supervising Provider    Answer:   Raliegh Ip [[id number]]  . fluticasone (FLONASE) 50 MCG/ACT nasal spray    Sig: Place 2 sprays into both nostrils daily.    Dispense:  16 g    Refill:  6    Order Specific Question:   Supervising Provider    Answer:   Raliegh Ip [[id number]]    Lauren Feeler PA-C [facility] [phone]

## 2019-10-27 ENCOUNTER — Other Ambulatory Visit: Payer: Self-pay | Admitting: Physician Assistant

## 2019-10-27 ENCOUNTER — Other Ambulatory Visit: Payer: Self-pay

## 2019-10-27 ENCOUNTER — Ambulatory Visit (INDEPENDENT_AMBULATORY_CARE_PROVIDER_SITE_OTHER): Payer: Medicaid Other | Admitting: *Deleted

## 2019-10-27 DIAGNOSIS — Z3042 Encounter for surveillance of injectable contraceptive: Secondary | ICD-10-CM

## 2019-10-27 DIAGNOSIS — Z0000 Encounter for general adult medical examination without abnormal findings: Secondary | ICD-10-CM

## 2019-10-27 MED ORDER — MEDROXYPROGESTERONE ACETATE 150 MG/ML IM SUSP
150.0000 mg | Freq: Once | INTRAMUSCULAR | Status: AC
  Administered 2019-10-27: 16:00:00 150 mg via INTRAMUSCULAR

## 2019-10-27 NOTE — Progress Notes (Signed)
Depo provera 150 mg given in left buttock, patient tolerated well.

## 2019-11-01 ENCOUNTER — Other Ambulatory Visit: Payer: Self-pay | Admitting: Physician Assistant

## 2019-11-01 DIAGNOSIS — N912 Amenorrhea, unspecified: Secondary | ICD-10-CM | POA: Diagnosis not present

## 2019-11-01 LAB — PREGNANCY, URINE: Preg Test, Ur: NEGATIVE

## 2019-11-03 ENCOUNTER — Ambulatory Visit (INDEPENDENT_AMBULATORY_CARE_PROVIDER_SITE_OTHER): Payer: Medicaid Other | Admitting: Family Medicine

## 2019-11-03 ENCOUNTER — Encounter: Payer: Self-pay | Admitting: Family Medicine

## 2019-11-03 DIAGNOSIS — M5431 Sciatica, right side: Secondary | ICD-10-CM | POA: Insufficient documentation

## 2019-11-03 MED ORDER — PREDNISONE 20 MG PO TABS: ORAL_TABLET | ORAL | 0 refills | Status: DC

## 2019-11-03 MED ORDER — NAPROXEN 500 MG PO TABS: 500.0000 mg | ORAL_TABLET | Freq: Two times a day (BID) | ORAL | 0 refills | Status: DC

## 2019-11-03 NOTE — Progress Notes (Signed)
Virtual Visit via telephone Note Due to COVID-19 pandemic this visit was conducted virtually. This visit type was conducted due to national recommendations for restrictions regarding the COVID-19 Pandemic (e.g. social distancing, sheltering in place) in an effort to limit this patient's exposure and mitigate transmission in our community. All issues noted in this document were discussed and addressed.  A physical exam was not performed with this format.   I connected with Lauren Green on 11/03/2019 at 0855 by telephone and verified that I am speaking with the correct person using two identifiers. Lauren Green is currently located at home and family is currently with them during visit. The provider, Kari Baars, FNP is located in their office at time of visit.  I discussed the limitations, risks, security and privacy concerns of performing an evaluation and management service by telephone and the availability of in person appointments. I also discussed with the patient that there may be a patient responsible charge related to this service. The patient expressed understanding and agreed to proceed.  Subjective:  Patient ID: Lauren Green, female    DOB: April 14, 1986, 34 y.o.   MRN: [id number]  Chief Complaint:  Leg Pain   HPI: Lauren Green is a 34 y.o. female presenting on 11/03/2019 for Leg Pain   Pt reports recurrent right sided sciatica. States onset three days ago. States this started after the birth of her son and comes and goes.   Leg Pain  The incident occurred 3 to 5 days ago. There was no injury mechanism. The pain is present in the right leg. The pain is at a severity of 5/10. The pain is moderate. The pain has been constant since onset. Associated symptoms include tingling. Pertinent negatives include no inability to bear weight, loss of motion, loss of sensation, muscle weakness or numbness. The symptoms are aggravated by movement and weight bearing. She has tried  acetaminophen and rest for the symptoms. The treatment provided no relief.     Relevant past medical, surgical, family, and social history reviewed and updated as indicated.  Allergies and medications reviewed and updated.   Past Medical History:  Diagnosis Date  . Heart murmur   . Migraine     Past Surgical History:  Procedure Laterality Date  . CESAREAN SECTION  08/31/2011   Procedure: CESAREAN SECTION;  Surgeon: Levi Aland;  Location: [facility];  Service: Gynecology;  Laterality: N/A;  primary cesarean section of baby boy  at 59  APGAR 9/9  . DILATION AND CURETTAGE OF UTERUS      Social History   Socioeconomic History  . Marital status: Single    Spouse name: Not on file  . Number of children: Not on file  . Years of education: Not on file  . Highest education level: Not on file  Occupational History  . Not on file  Tobacco Use  . Smoking status: Current Every Day Smoker    Packs/day: 0.50    Types: Cigarettes  . Smokeless tobacco: Never Used  Substance and Sexual Activity  . Alcohol use: Yes    Comment: occ  . Drug use: No  . Sexual activity: Yes    Birth control/protection: Injection  Other Topics Concern  . Not on file  Social History Narrative  . Not on file   Social Determinants of Health   Financial Resource Strain:   . Difficulty of Paying Living Expenses: Not on file  Food Insecurity:   . Worried About Radiation protection practitioner  of Food in the Last Year: Not on file  . Ran Out of Food in the Last Year: Not on file  Transportation Needs:   . Lack of Transportation (Medical): Not on file  . Lack of Transportation (Non-Medical): Not on file  Physical Activity:   . Days of Exercise per Week: Not on file  . Minutes of Exercise per Session: Not on file  Stress:   . Feeling of Stress : Not on file  Social Connections:   . Frequency of Communication with Friends and Family: Not on file  . Frequency of Social Gatherings with Friends and Family: Not on file  .  Attends Religious Services: Not on file  . Active Member of Clubs or Organizations: Not on file  . Attends Banker Meetings: Not on file  . Marital Status: Not on file  Intimate Partner Violence:   . Fear of Current or Ex-Partner: Not on file  . Emotionally Abused: Not on file  . Physically Abused: Not on file  . Sexually Abused: Not on file    Outpatient Encounter Medications as of 11/03/2019  Medication Sig  . acetaminophen (TYLENOL) 500 MG tablet Take 1,000 mg by mouth every 6 (six) hours as needed for pain.  . cetirizine (ZYRTEC) 10 MG tablet Take 1 tablet (10 mg total) by mouth daily.  . fluticasone (FLONASE) 50 MCG/ACT nasal spray Place 2 sprays into both nostrils daily.  . medroxyPROGESTERone (DEPO-PROVERA) 150 MG/ML injection Inject 1 mL (150 mg total) into the muscle every 3 (three) months.  . naproxen (NAPROSYN) 500 MG tablet Take 1 tablet (500 mg total) by mouth 2 (two) times daily with a meal.  . ondansetron (ZOFRAN) 4 MG tablet Take 1 tablet (4 mg total) by mouth every 8 (eight) hours as needed for nausea or vomiting.  . predniSONE (DELTASONE) 20 MG tablet 2 po at sametime daily for 5 days  . rizatriptan (MAXALT) 10 MG tablet Take 1 tablet (10 mg total) by mouth as needed.  . venlafaxine XR (EFFEXOR-XR) 37.5 MG 24 hr capsule Take 1-2 capsules (37.5-75 mg total) by mouth daily with breakfast.   Facility-Administered Encounter Medications as of 11/03/2019  Medication  . medroxyPROGESTERone (DEPO-PROVERA) injection 150 mg    Allergies  Allergen Reactions  . Penicillins     Review of Systems  Constitutional: Negative for activity change, appetite change, chills, diaphoresis, fatigue, fever and unexpected weight change.  HENT: Negative.   Eyes: Negative.  Negative for photophobia and visual disturbance.  Respiratory: Negative for cough, chest tightness and shortness of breath.   Cardiovascular: Negative for chest pain, palpitations and leg swelling.   Gastrointestinal: Negative for abdominal pain, blood in stool, constipation, diarrhea, nausea and vomiting.  Endocrine: Negative.  Negative for polydipsia, polyphagia and polyuria.  Genitourinary: Negative for decreased urine volume, difficulty urinating, dysuria, frequency and urgency.  Musculoskeletal: Positive for arthralgias and myalgias. Negative for back pain, gait problem, joint swelling, neck pain and neck stiffness.  Skin: Negative.   Allergic/Immunologic: Negative.   Neurological: Positive for tingling. Negative for dizziness, tremors, seizures, syncope, facial asymmetry, speech difficulty, weakness, light-headedness, numbness and headaches.  Hematological: Negative.   Psychiatric/Behavioral: Negative for confusion, hallucinations, sleep disturbance and suicidal ideas.  All other systems reviewed and are negative.        Observations/Objective: No vital signs or physical exam, this was a telephone or virtual health encounter.  Pt alert and oriented, answers all questions appropriately, and able to speak in full sentences.  Assessment and Plan: Mikaila was seen today for leg pain.  Diagnoses and all orders for this visit:  Right sided sciatica Right sided sciatica. No red flags concerning for cauda equina syndrome. Symptomatic care discussed in detail. Medications as prescribed. Report any new, worsening, or persistent symptoms.  -     predniSONE (DELTASONE) 20 MG tablet; 2 po at sametime daily for 5 days -     naproxen (NAPROSYN) 500 MG tablet; Take 1 tablet (500 mg total) by mouth 2 (two) times daily with a meal.     Follow Up Instructions: Return if symptoms worsen or fail to improve.    I discussed the assessment and treatment plan with the patient. The patient was provided an opportunity to ask questions and all were answered. The patient agreed with the plan and demonstrated an understanding of the instructions.   The patient was advised to call back or seek  an in-person evaluation if the symptoms worsen or if the condition fails to improve as anticipated.  The above assessment and management plan was discussed with the patient. The patient verbalized understanding of and has agreed to the management plan. Patient is aware to call the clinic if they develop any new symptoms or if symptoms persist or worsen. Patient is aware when to return to the clinic for a follow-up visit. Patient educated on when it is appropriate to go to the emergency department.    I provided 15 minutes of non-face-to-face time during this encounter. The call started at 0855. The call ended at 0910. The other time was used for coordination of care.    Monia Pouch, FNP-C [facility] [address] [place], [place] [address] [phone] 11/03/2019

## 2019-11-15 ENCOUNTER — Ambulatory Visit (INDEPENDENT_AMBULATORY_CARE_PROVIDER_SITE_OTHER): Payer: Medicaid Other | Admitting: Family

## 2019-11-15 ENCOUNTER — Telehealth: Payer: Self-pay | Admitting: Physician Assistant

## 2019-11-15 ENCOUNTER — Encounter: Payer: Self-pay | Admitting: Family

## 2019-11-15 DIAGNOSIS — G43909 Migraine, unspecified, not intractable, without status migrainosus: Secondary | ICD-10-CM

## 2019-11-15 MED ORDER — RIZATRIPTAN BENZOATE 10 MG PO TABS: 10.0000 mg | ORAL_TABLET | ORAL | 11 refills | Status: DC | PRN

## 2019-11-15 MED ORDER — FEXOFENADINE HCL 180 MG PO TABS: 180.0000 mg | ORAL_TABLET | Freq: Every day | ORAL | 1 refills | Status: DC

## 2019-11-15 NOTE — Telephone Encounter (Signed)
Patient states that she has been on Effexor x 1 year and did not have a migraine at all last year.  States she is also on rizatriptan and has not been taking it.  States she had a margarine Saturday, Sunday and felt one coming on this morning and has had to take rizatriptan.  Would like to know if medication needs to be increased.  Covering PCP-please advise

## 2019-11-15 NOTE — Telephone Encounter (Signed)
Please make an appointment for her so this can be discussed, can be with on-call provider if needed.

## 2019-11-15 NOTE — Progress Notes (Signed)
   Virtual Visit via telephone Note Due to COVID-19 pandemic this visit was conducted virtually. This visit type was conducted due to national recommendations for restrictions regarding the COVID-19 Pandemic (e.g. social distancing, sheltering in place) in an effort to limit this patient's exposure and mitigate transmission in our community. All issues noted in this document were discussed and addressed.  A physical exam was not performed with this format.  I connected with Lauren Green on 11/15/19 at 2:50 pm by telephone and verified that I am speaking with the correct person using two identifiers. Lauren [place] and son  is currently with her  during visit. The provider, Jannifer Rodney, FNP is located in their office at time of visit.  I discussed the limitations, risks, security and privacy concerns of performing an evaluation and management service by telephone and the availability of in person appointments. I also discussed with the patient that there may be a patient responsible charge related to this service. The patient expressed understanding and agreed to proceed.   History and Present Illness:  Migraine  This is a recurrent problem. The current episode started in the past 7 days. The problem occurs intermittently. The problem has been waxing and waning. The pain is located in the right unilateral region. The pain does not radiate. The pain quality is similar to prior headaches. The quality of the pain is described as stabbing and throbbing. The pain is at a severity of 10/10. The pain is moderate. Associated symptoms include blurred vision, nausea, phonophobia and photophobia. Exacerbated by: pollen. Treatments tried: zyrtec, flonase, maxalt. The treatment provided moderate relief. Her past medical history is significant for migraine headaches.      Review of Systems  Eyes: Positive for blurred vision and photophobia.  Gastrointestinal: Positive for  nausea.  All other systems reviewed and are negative.    Observations/Objective: No SOB or distress noted   Assessment and Plan: 1. Migraine without status migrainosus, not intractable, unspecified migraine type Avoid stress and caffeine Encourage at least 8 hours of sleep a night  Headache journal  Will stop zyrtec and start allegra, restart flonase Keep follow up with PCP - fexofenadine (ALLEGRA ALLERGY) 180 MG tablet; Take 1 tablet (180 mg total) by mouth daily.  Dispense: 90 tablet; Refill: 1 - rizatriptan (MAXALT) 10 MG tablet; Take 1 tablet (10 mg total) by mouth as needed.  Dispense: 10 tablet; Refill: 11    I discussed the assessment and treatment plan with the patient. The patient was provided an opportunity to ask questions and all were answered. The patient agreed with the plan and demonstrated an understanding of the instructions.   The patient was advised to call back or seek an in-person evaluation if the symptoms worsen or if the condition fails to improve as anticipated.  The above assessment and management plan was discussed with the patient. The patient verbalized understanding of and has agreed to the management plan. Patient is aware to call the clinic if symptoms persist or worsen. Patient is aware when to return to the clinic for a follow-up visit. Patient educated on when it is appropriate to go to the emergency department.   Time call ended:  2:59 pm   I provided 9 minutes of non-face-to-face time during this encounter.    Jannifer Rodney, FNP

## 2019-11-15 NOTE — Telephone Encounter (Signed)
Televisit given for today at 3:10pm. Patient did not have video capability.

## 2019-12-09 DIAGNOSIS — Z23 Encounter for immunization: Secondary | ICD-10-CM | POA: Diagnosis not present

## 2019-12-31 DIAGNOSIS — Z23 Encounter for immunization: Secondary | ICD-10-CM | POA: Diagnosis not present

## 2020-01-10 DIAGNOSIS — F4310 Post-traumatic stress disorder, unspecified: Secondary | ICD-10-CM | POA: Diagnosis not present

## 2020-01-11 DIAGNOSIS — F4310 Post-traumatic stress disorder, unspecified: Secondary | ICD-10-CM | POA: Diagnosis not present

## 2020-01-17 DIAGNOSIS — F4310 Post-traumatic stress disorder, unspecified: Secondary | ICD-10-CM | POA: Diagnosis not present

## 2020-01-19 ENCOUNTER — Ambulatory Visit: Payer: Medicaid Other

## 2020-01-24 DIAGNOSIS — F4310 Post-traumatic stress disorder, unspecified: Secondary | ICD-10-CM | POA: Diagnosis not present

## 2020-01-31 DIAGNOSIS — F4310 Post-traumatic stress disorder, unspecified: Secondary | ICD-10-CM | POA: Diagnosis not present

## 2020-02-08 DIAGNOSIS — F4310 Post-traumatic stress disorder, unspecified: Secondary | ICD-10-CM | POA: Diagnosis not present

## 2020-02-28 DIAGNOSIS — F4310 Post-traumatic stress disorder, unspecified: Secondary | ICD-10-CM | POA: Diagnosis not present

## 2020-03-07 DIAGNOSIS — F4310 Post-traumatic stress disorder, unspecified: Secondary | ICD-10-CM | POA: Diagnosis not present

## 2020-03-08 ENCOUNTER — Telehealth: Payer: Self-pay | Admitting: Family

## 2020-03-08 ENCOUNTER — Other Ambulatory Visit: Payer: Self-pay | Admitting: Family

## 2020-03-08 DIAGNOSIS — Z3009 Encounter for other general counseling and advice on contraception: Secondary | ICD-10-CM

## 2020-03-08 NOTE — Telephone Encounter (Signed)
  REFERRAL REQUEST Telephone Note 03/08/2020  What type of referral do you need? OBGYN  Why do you need this referral? Patient would like to have her tube tied.  Have you been seen at our office for this problem? Has current OBGYN they just need updated Ref. (Advise that they may need an appointment with their PCP before a referral can be done)  Is there a particular doctor or location that you prefer? [facility]  Patient notified that referrals can take up to a week or longer to process. If they haven't heard anything within a week they should call back and speak with the referral department.

## 2020-03-08 NOTE — Progress Notes (Signed)
Referral placed.

## 2020-03-20 DIAGNOSIS — F4310 Post-traumatic stress disorder, unspecified: Secondary | ICD-10-CM | POA: Diagnosis not present

## 2020-03-27 DIAGNOSIS — F4310 Post-traumatic stress disorder, unspecified: Secondary | ICD-10-CM | POA: Diagnosis not present

## 2020-03-30 DIAGNOSIS — Z0000 Encounter for general adult medical examination without abnormal findings: Secondary | ICD-10-CM | POA: Diagnosis not present

## 2020-03-30 DIAGNOSIS — Z6841 Body Mass Index (BMI) 40.0 and over, adult: Secondary | ICD-10-CM | POA: Diagnosis not present

## 2020-04-03 DIAGNOSIS — F4310 Post-traumatic stress disorder, unspecified: Secondary | ICD-10-CM | POA: Diagnosis not present

## 2020-04-12 DIAGNOSIS — F4310 Post-traumatic stress disorder, unspecified: Secondary | ICD-10-CM | POA: Diagnosis not present

## 2020-04-18 ENCOUNTER — Other Ambulatory Visit: Payer: Self-pay | Admitting: Obstetrics and Gynecology

## 2020-04-18 DIAGNOSIS — F4310 Post-traumatic stress disorder, unspecified: Secondary | ICD-10-CM | POA: Diagnosis not present

## 2020-04-24 ENCOUNTER — Telehealth: Payer: Self-pay | Admitting: *Deleted

## 2020-04-24 DIAGNOSIS — F4310 Post-traumatic stress disorder, unspecified: Secondary | ICD-10-CM | POA: Diagnosis not present

## 2020-04-24 NOTE — Telephone Encounter (Signed)
Approved today PA Case: [id number], Status: Approved, Coverage Starts on: 04/24/2020 12:00:00 AM, Coverage Ends on: 04/24/2021 12:00:00 AM WM aware

## 2020-04-24 NOTE — Telephone Encounter (Signed)
Key: [id number] Sent to plan  Fexofenadine 180

## 2020-05-01 DIAGNOSIS — F4310 Post-traumatic stress disorder, unspecified: Secondary | ICD-10-CM | POA: Diagnosis not present

## 2020-05-08 DIAGNOSIS — F4310 Post-traumatic stress disorder, unspecified: Secondary | ICD-10-CM | POA: Diagnosis not present

## 2020-05-16 DIAGNOSIS — F4310 Post-traumatic stress disorder, unspecified: Secondary | ICD-10-CM | POA: Diagnosis not present

## 2020-05-23 DIAGNOSIS — F4310 Post-traumatic stress disorder, unspecified: Secondary | ICD-10-CM | POA: Diagnosis not present

## 2020-05-30 DIAGNOSIS — F4310 Post-traumatic stress disorder, unspecified: Secondary | ICD-10-CM | POA: Diagnosis not present

## 2020-06-06 DIAGNOSIS — F4310 Post-traumatic stress disorder, unspecified: Secondary | ICD-10-CM | POA: Diagnosis not present

## 2020-06-07 ENCOUNTER — Other Ambulatory Visit: Payer: Self-pay | Admitting: Obstetrics and Gynecology

## 2020-06-13 DIAGNOSIS — F4310 Post-traumatic stress disorder, unspecified: Secondary | ICD-10-CM | POA: Diagnosis not present

## 2020-06-16 ENCOUNTER — Other Ambulatory Visit: Payer: Self-pay

## 2020-06-16 ENCOUNTER — Encounter (HOSPITAL_BASED_OUTPATIENT_CLINIC_OR_DEPARTMENT_OTHER): Payer: Self-pay | Admitting: Obstetrics and Gynecology

## 2020-06-16 NOTE — Progress Notes (Signed)
Left message with scheduler at dr Renne Crigler pt must be moved to [facility] or [phone]

## 2020-06-17 ENCOUNTER — Other Ambulatory Visit (HOSPITAL_COMMUNITY)
Admission: RE | Admit: 2020-06-17 | Discharge: 2020-06-17 | Disposition: A | Discharge status: Home / Self Care | Payer: Medicaid Other | Source: Ambulatory Visit | Attending: Obstetrics and Gynecology | Admitting: Obstetrics and Gynecology

## 2020-06-17 DIAGNOSIS — Z20822 Contact with and (suspected) exposure to covid-19: Secondary | ICD-10-CM | POA: Diagnosis not present

## 2020-06-17 DIAGNOSIS — Z01812 Encounter for preprocedural laboratory examination: Secondary | ICD-10-CM | POA: Insufficient documentation

## 2020-06-17 LAB — SARS CORONAVIRUS 2 (TAT 6-24 HRS): SARS Coronavirus 2: NEGATIVE

## 2020-06-20 DIAGNOSIS — F4310 Post-traumatic stress disorder, unspecified: Secondary | ICD-10-CM | POA: Diagnosis not present

## 2020-06-20 NOTE — Anesthesia Preprocedure Evaluation (Addendum)
Anesthesia Evaluation  Patient identified by MRN, date of birth, ID band Patient awake    Reviewed: Allergy & Precautions, NPO status , Patient's Chart, lab work & pertinent test results  History of Anesthesia Complications Negative for: history of anesthetic complications  Airway Mallampati: II  TM Distance: >3 FB Neck ROM: Full    Dental  (+) Dental Advisory Given, Teeth Intact   Pulmonary Current Smoker and Patient abstained from smoking.,    Pulmonary exam normal        Cardiovascular negative cardio ROS Normal cardiovascular exam     Neuro/Psych  Headaches, PSYCHIATRIC DISORDERS Anxiety Depression    GI/Hepatic GERD  Medicated and Controlled,(+)     substance abuse  marijuana use,   Endo/Other  Morbid obesity  Renal/GU negative Renal ROS     Musculoskeletal negative musculoskeletal ROS (+)   Abdominal   Peds  Hematology negative hematology ROS (+)   Anesthesia Other Findings Covid test negative   Reproductive/Obstetrics                            Anesthesia Physical Anesthesia Plan  ASA: III  Anesthesia Plan: General   Post-op Pain Management:    Induction: Intravenous  PONV Risk Score and Plan: 4 or greater and Treatment may vary due to age or medical condition, Ondansetron, Midazolam and Dexamethasone  Airway Management Planned: Oral ETT  Additional Equipment: None  Intra-op Plan:   Post-operative Plan: Extubation in OR  Informed Consent: I have reviewed the patients History and Physical, chart, labs and discussed the procedure including the risks, benefits and alternatives for the proposed anesthesia with the patient or authorized representative who has indicated his/her understanding and acceptance.     Dental advisory given  Plan Discussed with: CRNA and Anesthesiologist  Anesthesia Plan Comments:        Anesthesia Quick Evaluation

## 2020-06-20 NOTE — H&P (Signed)
34 y.o. [id number] desires sterilization with bilateral salpingectomies.  Past Medical History:  Diagnosis Date  . Anxiety   . Depression   . GERD (gastroesophageal reflux disease)   . Heart murmur    mild no current cardiologist  . Migraine    Past Surgical History:  Procedure Laterality Date  . CESAREAN SECTION  08/31/2011   Procedure: CESAREAN SECTION;  Surgeon: Levi Aland;  Location: [facility];  Service: Gynecology;  Laterality: N/A;  primary cesarean section of baby boy  at 15 APGAR 9/9  . DILATION AND CURETTAGE OF UTERUS  2004  . MULTIPLE TOOTH EXTRACTIONS  feb/march 2021    Social History   Socioeconomic History  . Marital status: Single    Spouse name: Not on file  . Number of children: Not on file  . Years of education: Not on file  . Highest education level: Not on file  Occupational History  . Not on file  Tobacco Use  . Smoking status: Current Every Day Smoker    Packs/day: 0.50    Years: 15.00    Pack years: 7.50    Types: Cigarettes  . Smokeless tobacco: Never Used  . Tobacco comment: 5-7- cigs per day  Vaping Use  . Vaping Use: Never used  Substance and Sexual Activity  . Alcohol use: Not Currently  . Drug use: Yes    Types: Marijuana    Comment: marijiana occ last used 06-14-2020  . Sexual activity: Yes    Birth control/protection: Injection  Other Topics Concern  . Not on file  Social History Narrative  . Not on file   Social Determinants of Health   Financial Resource Strain:   . Difficulty of Paying Living Expenses: Not on file  Food Insecurity:   . Worried About Programme researcher, broadcasting/film/video in the Last Year: Not on file  . Ran Out of Food in the Last Year: Not on file  Transportation Needs:   . Lack of Transportation (Medical): Not on file  . Lack of Transportation (Non-Medical): Not on file  Physical Activity:   . Days of Exercise per Week: Not on file  . Minutes of Exercise per Session: Not on file  Stress:   . Feeling of Stress : Not on  file  Social Connections:   . Frequency of Communication with Friends and Family: Not on file  . Frequency of Social Gatherings with Friends and Family: Not on file  . Attends Religious Services: Not on file  . Active Member of Clubs or Organizations: Not on file  . Attends Banker Meetings: Not on file  . Marital Status: Not on file  Intimate Partner Violence:   . Fear of Current or Ex-Partner: Not on file  . Emotionally Abused: Not on file  . Physically Abused: Not on file  . Sexually Abused: Not on file    Current Facility-Administered Medications on File Prior to Encounter  Medication Dose Route Frequency Provider Last Rate Last Admin  . medroxyPROGESTERone (DEPO-PROVERA) injection 150 mg  150 mg Intramuscular Q90 days Remus Loffler, PA-C   150 mg at 07/28/19 1115   Current Outpatient Medications on File Prior to Encounter  Medication Sig Dispense Refill  . calcium carbonate (TUMS - DOSED IN MG ELEMENTAL CALCIUM) 500 MG chewable tablet Chew 500 mg by mouth daily as needed for indigestion or heartburn.     . esomeprazole (NEXIUM) 20 MG capsule Take 20 mg by mouth at bedtime.    . fexofenadine (  ALLEGRA ALLERGY) 180 MG tablet Take 1 tablet (180 mg total) by mouth daily. (Patient taking differently: Take 180 mg by mouth at bedtime. ) 90 tablet 1  . fluticasone (FLONASE) 50 MCG/ACT nasal spray Place 2 sprays into both nostrils daily. 16 g 6  . ibuprofen (ADVIL) 200 MG tablet Take 400 mg by mouth every 6 (six) hours as needed for mild pain or moderate pain.     . rizatriptan (MAXALT) 10 MG tablet Take 1 tablet (10 mg total) by mouth as needed. (Patient taking differently: Take 10 mg by mouth as needed for migraine. ) 10 tablet 11  . venlafaxine XR (EFFEXOR-XR) 37.5 MG 24 hr capsule Take 1-2 capsules (37.5-75 mg total) by mouth daily with breakfast. (Patient taking differently: Take 75 mg by mouth at bedtime. ) 60 capsule 11  . medroxyPROGESTERone (DEPO-PROVERA) 150 MG/ML  injection Inject 1 mL (150 mg total) into the muscle every 3 (three) months. (Patient not taking: Reported on 06/16/2020) 1 mL 3    Allergies  Allergen Reactions  . Penicillins Itching    Childhood    Vitals:   06/16/20 0914  Weight: 136.1 kg  Height: 5\' 2"  (1.575 m)    Lungs: clear to ascultation Cor:  RRR Abdomen:  soft, nontender, nondistended. Ex:  no cords, erythema Pelvic:   .   Vulva: no masses, no atrophy, no lesions .   Vagina: no tenderness, no erythema, no abnormal vaginal discharge, no vesicle(s) or ulcers, no cystocele, no rectocele .   Cervix: grossly normal, no discharge, no cervical motion tenderness .   Uterus: normal size (7), normal shape, midline, no uterine prolapse, non-tender .   Bladder/Urethra: normal meatus, no urethral discharge, no urethral mass, bladder non distended, Urethra well supported .   Adnexa/Parametria: no parametrial tenderness, no parametrial mass, no adnexal tenderness, no ovarian mass  A:  For bilateral salpingectomies for sterilization.   P: P: All risks, benefits and alternatives d/w patient and she desires to proceed.  Patient will have ERAS protocol and SCDs during the operation.  

## 2020-06-21 ENCOUNTER — Ambulatory Visit (HOSPITAL_COMMUNITY): Payer: Medicaid Other | Admitting: Certified Registered"

## 2020-06-21 ENCOUNTER — Other Ambulatory Visit: Payer: Self-pay

## 2020-06-21 ENCOUNTER — Ambulatory Visit (HOSPITAL_COMMUNITY)
Admission: RE | Admit: 2020-06-21 | Discharge: 2020-06-21 | Disposition: A | Discharge status: Home / Self Care | Payer: Medicaid Other | Source: Ambulatory Visit | Attending: Obstetrics and Gynecology | Admitting: Obstetrics and Gynecology

## 2020-06-21 ENCOUNTER — Encounter (HOSPITAL_COMMUNITY)
Admission: RE | Disposition: A | Discharge status: Home / Self Care | Payer: Self-pay | Source: Ambulatory Visit | Attending: Obstetrics and Gynecology

## 2020-06-21 ENCOUNTER — Encounter (HOSPITAL_COMMUNITY): Payer: Self-pay | Admitting: Obstetrics and Gynecology

## 2020-06-21 DIAGNOSIS — F17210 Nicotine dependence, cigarettes, uncomplicated: Secondary | ICD-10-CM | POA: Diagnosis not present

## 2020-06-21 DIAGNOSIS — G43909 Migraine, unspecified, not intractable, without status migrainosus: Secondary | ICD-10-CM | POA: Diagnosis not present

## 2020-06-21 DIAGNOSIS — F419 Anxiety disorder, unspecified: Secondary | ICD-10-CM | POA: Insufficient documentation

## 2020-06-21 DIAGNOSIS — F32A Depression, unspecified: Secondary | ICD-10-CM | POA: Insufficient documentation

## 2020-06-21 DIAGNOSIS — K219 Gastro-esophageal reflux disease without esophagitis: Secondary | ICD-10-CM | POA: Diagnosis not present

## 2020-06-21 DIAGNOSIS — Z302 Encounter for sterilization: Secondary | ICD-10-CM | POA: Insufficient documentation

## 2020-06-21 DIAGNOSIS — Z79899 Other long term (current) drug therapy: Secondary | ICD-10-CM | POA: Insufficient documentation

## 2020-06-21 HISTORY — DX: Gastro-esophageal reflux disease without esophagitis: K21.9

## 2020-06-21 HISTORY — PX: LAPAROSCOPIC TUBAL LIGATION: SHX1937

## 2020-06-21 HISTORY — DX: Anxiety disorder, unspecified: F41.9

## 2020-06-21 HISTORY — DX: Depression, unspecified: F32.A

## 2020-06-21 LAB — CBC
HCT: 38.3 % (ref 36.0–46.0)
Hemoglobin: 12.5 g/dL (ref 12.0–15.0)
MCH: 29.1 pg (ref 26.0–34.0)
MCHC: 32.6 g/dL (ref 30.0–36.0)
MCV: 89.1 fL (ref 80.0–100.0)
Platelets: 265 10*3/uL (ref 150–400)
RBC: 4.3 MIL/uL (ref 3.87–5.11)
RDW: 14 % (ref 11.5–15.5)
WBC: 6 10*3/uL (ref 4.0–10.5)
nRBC: 0 % (ref 0.0–0.2)

## 2020-06-21 LAB — PREGNANCY, URINE: Preg Test, Ur: NEGATIVE

## 2020-06-21 SURGERY — LIGATION, FALLOPIAN TUBE, LAPAROSCOPIC
Anesthesia: General | Site: Abdomen | Laterality: Bilateral

## 2020-06-21 MED ORDER — ROPIVACAINE HCL 5 MG/ML IJ SOLN
30.0000 mL | Freq: Once | INTRAMUSCULAR | Status: AC
  Administered 2020-06-21: 30 mL

## 2020-06-21 MED ORDER — LACTATED RINGERS IV SOLN: INTRAVENOUS | Status: DC

## 2020-06-21 MED ORDER — GABAPENTIN 300 MG PO CAPS
300.0000 mg | ORAL_CAPSULE | ORAL | Status: AC
  Administered 2020-06-21: 300 mg via ORAL
  Filled 2020-06-21: qty 1

## 2020-06-21 MED ORDER — FENTANYL CITRATE (PF) 100 MCG/2ML IJ SOLN: 25.0000 ug | INTRAMUSCULAR | Status: DC | PRN

## 2020-06-21 MED ORDER — OXYCODONE HCL 5 MG/5ML PO SOLN: 5.0000 mg | Freq: Once | ORAL | Status: DC | PRN

## 2020-06-21 MED ORDER — DEXAMETHASONE SODIUM PHOSPHATE 10 MG/ML IJ SOLN
INTRAMUSCULAR | Status: AC
  Filled 2020-06-21: qty 1

## 2020-06-21 MED ORDER — SUCCINYLCHOLINE CHLORIDE 200 MG/10ML IV SOSY
PREFILLED_SYRINGE | INTRAVENOUS | Status: DC | PRN
  Administered 2020-06-21: 140 mg via INTRAVENOUS

## 2020-06-21 MED ORDER — LIDOCAINE 2% (20 MG/ML) 5 ML SYRINGE
INTRAMUSCULAR | Status: AC
  Filled 2020-06-21: qty 5

## 2020-06-21 MED ORDER — POVIDONE-IODINE 10 % EX SWAB
2.0000 "application " | Freq: Once | CUTANEOUS | Status: AC
  Administered 2020-06-21: 2 via TOPICAL

## 2020-06-21 MED ORDER — PROPOFOL 10 MG/ML IV BOLUS
INTRAVENOUS | Status: DC | PRN
  Administered 2020-06-21: 250 mg via INTRAVENOUS

## 2020-06-21 MED ORDER — LIDOCAINE 2% (20 MG/ML) 5 ML SYRINGE
INTRAMUSCULAR | Status: DC | PRN
  Administered 2020-06-21: 40 mg via INTRAVENOUS

## 2020-06-21 MED ORDER — ONDANSETRON HCL 4 MG/2ML IJ SOLN
INTRAMUSCULAR | Status: AC
  Filled 2020-06-21: qty 2

## 2020-06-21 MED ORDER — CELECOXIB 200 MG PO CAPS
400.0000 mg | ORAL_CAPSULE | ORAL | Status: AC
  Administered 2020-06-21: 400 mg via ORAL
  Filled 2020-06-21: qty 2

## 2020-06-21 MED ORDER — FENTANYL CITRATE (PF) 250 MCG/5ML IJ SOLN
INTRAMUSCULAR | Status: DC | PRN
Start: 2020-06-21 — End: 2020-06-21
  Administered 2020-06-21: 100 ug via INTRAVENOUS
  Administered 2020-06-21: 50 ug via INTRAVENOUS

## 2020-06-21 MED ORDER — SOD CITRATE-CITRIC ACID 500-334 MG/5ML PO SOLN
30.0000 mL | ORAL | Status: AC
  Administered 2020-06-21: 30 mL via ORAL
  Filled 2020-06-21: qty 30

## 2020-06-21 MED ORDER — FENTANYL CITRATE (PF) 100 MCG/2ML IJ SOLN
INTRAMUSCULAR | Status: AC
  Filled 2020-06-21: qty 2

## 2020-06-21 MED ORDER — PROPOFOL 10 MG/ML IV BOLUS
INTRAVENOUS | Status: AC
  Filled 2020-06-21: qty 40

## 2020-06-21 MED ORDER — SODIUM CHLORIDE (PF) 0.9 % IJ SOLN
INTRAMUSCULAR | Status: AC
  Filled 2020-06-21: qty 50

## 2020-06-21 MED ORDER — ROCURONIUM BROMIDE 10 MG/ML (PF) SYRINGE
PREFILLED_SYRINGE | INTRAVENOUS | Status: DC | PRN
  Administered 2020-06-21: 40 mg via INTRAVENOUS

## 2020-06-21 MED ORDER — DEXAMETHASONE SODIUM PHOSPHATE 10 MG/ML IJ SOLN
INTRAMUSCULAR | Status: DC | PRN
  Administered 2020-06-21: 4 mg via INTRAVENOUS

## 2020-06-21 MED ORDER — ROCURONIUM BROMIDE 10 MG/ML (PF) SYRINGE
PREFILLED_SYRINGE | INTRAVENOUS | Status: AC
  Filled 2020-06-21: qty 10

## 2020-06-21 MED ORDER — SUGAMMADEX SODIUM 500 MG/5ML IV SOLN
INTRAVENOUS | Status: AC
  Filled 2020-06-21: qty 5

## 2020-06-21 MED ORDER — MIDAZOLAM HCL 2 MG/2ML IJ SOLN
INTRAMUSCULAR | Status: AC
  Filled 2020-06-21: qty 2

## 2020-06-21 MED ORDER — SODIUM CHLORIDE (PF) 0.9 % IJ SOLN
INTRAMUSCULAR | Status: DC | PRN
  Administered 2020-06-21: 30 mL

## 2020-06-21 MED ORDER — FENTANYL CITRATE (PF) 100 MCG/2ML IJ SOLN
INTRAMUSCULAR | Status: AC
Start: 2020-06-21 — End: ?
  Filled 2020-06-21: qty 2

## 2020-06-21 MED ORDER — ONDANSETRON HCL 4 MG/2ML IJ SOLN
INTRAMUSCULAR | Status: DC | PRN
  Administered 2020-06-21: 4 mg via INTRAVENOUS

## 2020-06-21 MED ORDER — ROPIVACAINE HCL 5 MG/ML IJ SOLN
INTRAMUSCULAR | Status: AC
  Filled 2020-06-21: qty 30

## 2020-06-21 MED ORDER — MIDAZOLAM HCL 2 MG/2ML IJ SOLN
INTRAMUSCULAR | Status: DC | PRN
  Administered 2020-06-21: 2 mg via INTRAVENOUS

## 2020-06-21 MED ORDER — OXYCODONE HCL 5 MG PO TABS: 5.0000 mg | ORAL_TABLET | Freq: Once | ORAL | Status: DC | PRN

## 2020-06-21 MED ORDER — PHENYLEPHRINE 40 MCG/ML (10ML) SYRINGE FOR IV PUSH (FOR BLOOD PRESSURE SUPPORT)
PREFILLED_SYRINGE | INTRAVENOUS | Status: DC | PRN
  Administered 2020-06-21 (×3): 40 ug via INTRAVENOUS

## 2020-06-21 MED ORDER — PROMETHAZINE HCL 25 MG/ML IJ SOLN: 6.2500 mg | INTRAMUSCULAR | Status: DC | PRN

## 2020-06-21 MED ORDER — ACETAMINOPHEN 500 MG PO TABS
1000.0000 mg | ORAL_TABLET | ORAL | Status: AC
  Administered 2020-06-21: 1000 mg via ORAL
  Filled 2020-06-21: qty 2

## 2020-06-21 MED ORDER — SUGAMMADEX SODIUM 200 MG/2ML IV SOLN
INTRAVENOUS | Status: DC | PRN
  Administered 2020-06-21: 300 mg via INTRAVENOUS

## 2020-06-21 MED ORDER — OXYCODONE-ACETAMINOPHEN 5-325 MG PO TABS
1.0000 | ORAL_TABLET | ORAL | 0 refills | Status: DC | PRN
Start: 2020-06-21 — End: 2020-12-07

## 2020-06-21 SURGICAL SUPPLY — 33 items
CATH ROBINSON RED A/P 16FR (CATHETERS) ×2 IMPLANT
CHLORAPREP W/TINT 26 (MISCELLANEOUS) ×4 IMPLANT
COVER SURGICAL LIGHT HANDLE (MISCELLANEOUS) ×2 IMPLANT
COVER WAND RF STERILE (DRAPES) IMPLANT
DERMABOND ADVANCED (GAUZE/BANDAGES/DRESSINGS) ×1
DERMABOND ADVANCED .7 DNX12 (GAUZE/BANDAGES/DRESSINGS) ×1 IMPLANT
DRSG OPSITE POSTOP 3X4 (GAUZE/BANDAGES/DRESSINGS) ×2 IMPLANT
DURAPREP 26ML APPLICATOR (WOUND CARE) IMPLANT
GLOVE BIO SURGEON STRL SZ7 (GLOVE) ×4 IMPLANT
GLOVE BIOGEL PI IND STRL 7.0 (GLOVE) ×2 IMPLANT
GLOVE BIOGEL PI INDICATOR 7.0 (GLOVE) ×2
GLOVE ECLIPSE 6.5 STRL STRAW (GLOVE) IMPLANT
GOWN STRL REUS W/ TWL LRG LVL3 (GOWN DISPOSABLE) ×3 IMPLANT
GOWN STRL REUS W/TWL LRG LVL3 (GOWN DISPOSABLE) ×6
HIBICLENS CHG 4% 4OZ BTL (MISCELLANEOUS) IMPLANT
KIT TURNOVER KIT A (KITS) ×2 IMPLANT
NEEDLE INSUFFLATION 120MM (ENDOMECHANICALS) ×2 IMPLANT
PACK LAPAROSCOPY BASIN (CUSTOM PROCEDURE TRAY) ×2 IMPLANT
PACK TRENDGUARD 450 HYBRID PRO (MISCELLANEOUS) ×1 IMPLANT
PAD POSITIONING PINK XL (MISCELLANEOUS) IMPLANT
PENCIL SMOKE EVACUATOR (MISCELLANEOUS) IMPLANT
PROTECTOR NERVE ULNAR (MISCELLANEOUS) ×6 IMPLANT
SLEEVE XCEL OPT CAN 5 100 (ENDOMECHANICALS) ×2 IMPLANT
SUT VIC AB 2-0 UR6 27 (SUTURE) ×2 IMPLANT
SUT VICRYL RAPIDE 3 0 (SUTURE) ×4 IMPLANT
SYR 50ML LL SCALE MARK (SYRINGE) ×2 IMPLANT
TAPE CLOTH 4X10 WHT NS (GAUZE/BANDAGES/DRESSINGS) IMPLANT
TOWEL OR 17X26 10 PK STRL BLUE (TOWEL DISPOSABLE) ×4 IMPLANT
TRENDGUARD 450 HYBRID PRO PACK (MISCELLANEOUS) ×2
TROCAR OPTI TIP 5M 100M (ENDOMECHANICALS) IMPLANT
TROCAR XCEL DIL TIP R 11M (ENDOMECHANICALS) ×2 IMPLANT
TUBING INSUF HEATED (TUBING) ×2 IMPLANT
WARMER LAPAROSCOPE (MISCELLANEOUS) IMPLANT

## 2020-06-21 NOTE — Op Note (Signed)
06/21/2020  9:50 AM  PATIENT:  Lauren Green  34 y.o. female  PRE-OPERATIVE DIAGNOSIS:  sterilization  POST-OPERATIVE DIAGNOSIS:  sterilization  PROCEDURE:  Procedure(s) with comments: LAPAROSCOPIC TUBAL LIGATION (Bilateral) - Filschie Clips -- possible salpingectomy  SURGEON:  Surgeon(s) and Role:    * Carrington Clamp, MD - Primary    * Taam-Akelman, Griselda Miner, MD - Assisting  ANESTHESIA:   general  EBL:  10 mL   LOCAL MEDICATIONS USED:  OTHER marcaine intraperitoneal  SPECIMEN:  Source of Specimen:  bilateral tubes  DISPOSITION OF SPECIMEN:  PATHOLOGY  COUNTS:  YES  TOURNIQUET:  * No tourniquets in log *  DICTATION: .Note written in EPIC  PLAN OF CARE: Discharge to home after PACU  PATIENT DISPOSITION:  PACU - hemodynamically stable.   Delay start of Pharmacological VTE agent (>24hrs) due to surgical blood loss or risk of bleeding: not applicable  Meds: none  Complications:  none  Findings: normal uterus, tubes and ovaries.  Technique  After adequate general endotracheal anesthesia was achieved, the patient was prepped and draped in the usual sterile fashion.  A 2 cm incision was made just below the umbilicus and the abdominal wall tented up.  The Veress needle was inserted at a 45 degree angle to the pelvis and no bowel contents or blood were aspirated.  The abdomen was insufflated and the 10 mm trocar placed without complication.  The operative scope was introduced and the above findings noted.  The ligasure instrument was introduced and the tubes were followed to their fimbriated ends bilaterally.  Each tube was cauterized and cut along the mesosalpinx and then across the tube just distal to the cornua.   The 5 mm scope was introduced and the tubes removed through the 10 trocar.  After a quick inspection of the pelvis and liver edge and  introduction of the marcaine, all instruments were removed and the abdomen was desufflated.  The 10 mm faschial incision  was closed with a figure of eight stitch of 2-vicryl and the skin was closed with dermabond.  All instruments were removed from the vagina and the patient returned to the PACU in stable condition.  [name] A

## 2020-06-21 NOTE — Anesthesia Postprocedure Evaluation (Signed)
Anesthesia Post Note  Patient: Lauren Green  Procedure(s) Performed: LAPAROSCOPIC TUBAL LIGATION (Bilateral Abdomen)     Patient location during evaluation: PACU Anesthesia Type: General Level of consciousness: awake and alert Pain management: pain level controlled Vital Signs Assessment: post-procedure vital signs reviewed and stable Respiratory status: spontaneous breathing, nonlabored ventilation and respiratory function stable Cardiovascular status: blood pressure returned to baseline and stable Postop Assessment: no apparent nausea or vomiting Anesthetic complications: no   No complications documented.  Last Vitals:  Vitals:   06/21/20 1010 06/21/20 1030  BP: 126/89 131/88  Pulse: 61 63  Resp: 17 16  Temp: 36.5 C 36.4 C  SpO2: 100% 100%    Last Pain:  Vitals:   06/21/20 1030  TempSrc:   PainSc: 0-No pain                 Beryle Lathe

## 2020-06-21 NOTE — Brief Op Note (Signed)
06/21/2020  9:50 AM  PATIENT:  Mosetta Putt  34 y.o. female  PRE-OPERATIVE DIAGNOSIS:  sterilization  POST-OPERATIVE DIAGNOSIS:  sterilization  PROCEDURE:  Procedure(s) with comments: LAPAROSCOPIC TUBAL LIGATION (Bilateral) - Filschie Clips -- possible salpingectomy  SURGEON:  Surgeon(s) and Role:    * Carrington Clamp, MD - Primary    * Taam-Akelman, Griselda Miner, MD - Assisting  ANESTHESIA:   general  EBL:  10 mL   LOCAL MEDICATIONS USED:  OTHER marcaine intraperitoneal  SPECIMEN:  Source of Specimen:  bilateral tubes  DISPOSITION OF SPECIMEN:  PATHOLOGY  COUNTS:  YES  TOURNIQUET:  * No tourniquets in log *  DICTATION: .Note written in EPIC  PLAN OF CARE: Discharge to home after PACU  PATIENT DISPOSITION:  PACU - hemodynamically stable.   Delay start of Pharmacological VTE agent (>24hrs) due to surgical blood loss or risk of bleeding: not applicable

## 2020-06-21 NOTE — Anesthesia Procedure Notes (Signed)
Procedure Name: Intubation Date/Time: 06/21/2020 8:41 AM Performed by: Eben Burow, CRNA Pre-anesthesia Checklist: Patient identified, Emergency Drugs available, Suction available, Patient being monitored and Timeout performed Patient Re-evaluated:Patient Re-evaluated prior to induction Oxygen Delivery Method: Circle system utilized Preoxygenation: Pre-oxygenation with 100% oxygen Induction Type: IV induction Ventilation: Mask ventilation without difficulty Laryngoscope Size: Mac and 4 Grade View: Grade II Tube type: Oral Tube size: 7.0 mm Number of attempts: 1 Airway Equipment and Method: Stylet Placement Confirmation: ETT inserted through vocal cords under direct vision,  positive ETCO2 and breath sounds checked- equal and bilateral Secured at: 21 cm Tube secured with: Tape Dental Injury: Teeth and Oropharynx as per pre-operative assessment

## 2020-06-21 NOTE — Progress Notes (Signed)
There has been no change in the patients history, status or exam since the history and physical.  Vitals:   06/16/20 0914 06/21/20 0652  BP:  (!) 142/92  Pulse:  67  Resp:  18  Temp:  98.9 F (37.2 C)  TempSrc:  Oral  SpO2:  99%  Weight: 136.1 kg (!) 136.4 kg  Height: 5\' 2"  (1.575 m) 5\' 2"  (1.575 m)    Results for orders placed or performed during the hospital encounter of 06/21/20 (from the past 72 hour(s))  CBC     Status: None   Collection Time: 06/21/20  6:25 AM  Result Value Ref Range   WBC 6.0 4.0 - 10.5 K/uL   RBC 4.30 3.87 - 5.11 MIL/uL   Hemoglobin 12.5 12.0 - 15.0 g/dL   HCT 06/23/20 36 - 46 %   MCV 89.1 80.0 - 100.0 fL   MCH 29.1 26.0 - 34.0 pg   MCHC 32.6 30.0 - 36.0 g/dL   RDW 06/23/20 60.1 - 56.1 %   Platelets 265 150 - 400 K/uL   nRBC 0.0 0.0 - 0.2 %    Comment: Performed at [facility], [address], [place], [place]  Pregnancy, urine     Status: None   Collection Time: 06/21/20  6:25 AM  Result Value Ref Range   Preg Test, Ur NEGATIVE NEGATIVE    Comment:        THE SENSITIVITY OF THIS METHODOLOGY IS >20 mIU/mL. Performed at [facility], [address] [place], [place]     [place]

## 2020-06-21 NOTE — Transfer of Care (Signed)
Immediate Anesthesia Transfer of Care Note  Patient: Lauren Green  Procedure(s) Performed: LAPAROSCOPIC TUBAL LIGATION (Bilateral Abdomen)  Patient Location: PACU  Anesthesia Type:General  Level of Consciousness: awake, alert  and patient cooperative  Airway & Oxygen Therapy: Patient Spontanous Breathing and Patient connected to face mask oxygen  Post-op Assessment: Report given to RN and Post -op Vital signs reviewed and stable  Post vital signs: Reviewed and stable  Last Vitals:  Vitals Value Taken Time  BP    Temp    Pulse 80 06/21/20 0940  Resp 15 06/21/20 0940  SpO2 100 % 06/21/20 0940  Vitals shown include unvalidated device data.  Last Pain:  Vitals:   06/21/20 0652  TempSrc: Oral         Complications: No complications documented.

## 2020-06-22 LAB — SURGICAL PATHOLOGY

## 2020-06-24 ENCOUNTER — Encounter (HOSPITAL_COMMUNITY): Payer: Self-pay | Admitting: Obstetrics and Gynecology

## 2020-06-27 DIAGNOSIS — F4310 Post-traumatic stress disorder, unspecified: Secondary | ICD-10-CM | POA: Diagnosis not present

## 2020-07-04 DIAGNOSIS — F4310 Post-traumatic stress disorder, unspecified: Secondary | ICD-10-CM | POA: Diagnosis not present

## 2020-07-11 DIAGNOSIS — F4310 Post-traumatic stress disorder, unspecified: Secondary | ICD-10-CM | POA: Diagnosis not present

## 2020-07-18 DIAGNOSIS — F4310 Post-traumatic stress disorder, unspecified: Secondary | ICD-10-CM | POA: Diagnosis not present

## 2020-07-25 DIAGNOSIS — F4310 Post-traumatic stress disorder, unspecified: Secondary | ICD-10-CM | POA: Diagnosis not present

## 2020-08-01 DIAGNOSIS — F4310 Post-traumatic stress disorder, unspecified: Secondary | ICD-10-CM | POA: Diagnosis not present

## 2020-08-08 DIAGNOSIS — F4310 Post-traumatic stress disorder, unspecified: Secondary | ICD-10-CM | POA: Diagnosis not present

## 2020-08-15 DIAGNOSIS — F4310 Post-traumatic stress disorder, unspecified: Secondary | ICD-10-CM | POA: Diagnosis not present

## 2020-08-22 DIAGNOSIS — F4310 Post-traumatic stress disorder, unspecified: Secondary | ICD-10-CM | POA: Diagnosis not present

## 2020-08-25 DIAGNOSIS — F4310 Post-traumatic stress disorder, unspecified: Secondary | ICD-10-CM | POA: Diagnosis not present

## 2020-08-29 DIAGNOSIS — F4310 Post-traumatic stress disorder, unspecified: Secondary | ICD-10-CM | POA: Diagnosis not present

## 2020-09-05 DIAGNOSIS — F4310 Post-traumatic stress disorder, unspecified: Secondary | ICD-10-CM | POA: Diagnosis not present

## 2020-09-12 DIAGNOSIS — F4310 Post-traumatic stress disorder, unspecified: Secondary | ICD-10-CM | POA: Diagnosis not present

## 2020-09-18 DIAGNOSIS — F4310 Post-traumatic stress disorder, unspecified: Secondary | ICD-10-CM | POA: Diagnosis not present

## 2020-09-26 DIAGNOSIS — F4310 Post-traumatic stress disorder, unspecified: Secondary | ICD-10-CM | POA: Diagnosis not present

## 2020-10-02 DIAGNOSIS — F4310 Post-traumatic stress disorder, unspecified: Secondary | ICD-10-CM | POA: Diagnosis not present

## 2020-10-10 DIAGNOSIS — F4310 Post-traumatic stress disorder, unspecified: Secondary | ICD-10-CM | POA: Diagnosis not present

## 2020-10-11 ENCOUNTER — Other Ambulatory Visit: Payer: Self-pay

## 2020-10-11 ENCOUNTER — Ambulatory Visit (INDEPENDENT_AMBULATORY_CARE_PROVIDER_SITE_OTHER): Payer: Medicaid Other

## 2020-10-11 DIAGNOSIS — Z23 Encounter for immunization: Secondary | ICD-10-CM | POA: Diagnosis not present

## 2020-10-11 NOTE — Progress Notes (Signed)
   Covid-19 Vaccination Clinic  Name:  ANIKO FINNIGAN    MRN: [id number] DOB: Jan 18, 1986  10/11/2020  Ms. Martinek was observed post Covid-19 immunization for 15 minutes without incident. She was provided with Vaccine Information Sheet and instruction to access the V-Safe system.   Ms. Vencill was instructed to call 911 with any severe reactions post vaccine: Marland Kitchen Difficulty breathing  . Swelling of face and throat  . A fast heartbeat  . A bad rash all over body  . Dizziness and weakness   Immunizations Administered    Name Date Dose VIS Date Route   Pfizer COVID-19 Vaccine 10/11/2020  2:35 PM 0.3 mL 06/28/2020 Intramuscular   Manufacturer: [employer], [employer]   Lot: [id number]   NDC: [address]-9

## 2020-10-16 DIAGNOSIS — M9902 Segmental and somatic dysfunction of thoracic region: Secondary | ICD-10-CM | POA: Diagnosis not present

## 2020-10-16 DIAGNOSIS — M9903 Segmental and somatic dysfunction of lumbar region: Secondary | ICD-10-CM | POA: Diagnosis not present

## 2020-10-16 DIAGNOSIS — M62830 Muscle spasm of back: Secondary | ICD-10-CM | POA: Diagnosis not present

## 2020-10-16 DIAGNOSIS — M9901 Segmental and somatic dysfunction of cervical region: Secondary | ICD-10-CM | POA: Diagnosis not present

## 2020-10-17 DIAGNOSIS — F4310 Post-traumatic stress disorder, unspecified: Secondary | ICD-10-CM | POA: Diagnosis not present

## 2020-10-18 DIAGNOSIS — M9901 Segmental and somatic dysfunction of cervical region: Secondary | ICD-10-CM | POA: Diagnosis not present

## 2020-10-18 DIAGNOSIS — M9903 Segmental and somatic dysfunction of lumbar region: Secondary | ICD-10-CM | POA: Diagnosis not present

## 2020-10-18 DIAGNOSIS — M9902 Segmental and somatic dysfunction of thoracic region: Secondary | ICD-10-CM | POA: Diagnosis not present

## 2020-10-18 DIAGNOSIS — M62830 Muscle spasm of back: Secondary | ICD-10-CM | POA: Diagnosis not present

## 2020-10-19 DIAGNOSIS — M9901 Segmental and somatic dysfunction of cervical region: Secondary | ICD-10-CM | POA: Diagnosis not present

## 2020-10-19 DIAGNOSIS — M9902 Segmental and somatic dysfunction of thoracic region: Secondary | ICD-10-CM | POA: Diagnosis not present

## 2020-10-19 DIAGNOSIS — M62830 Muscle spasm of back: Secondary | ICD-10-CM | POA: Diagnosis not present

## 2020-10-19 DIAGNOSIS — M9903 Segmental and somatic dysfunction of lumbar region: Secondary | ICD-10-CM | POA: Diagnosis not present

## 2020-10-23 DIAGNOSIS — M9901 Segmental and somatic dysfunction of cervical region: Secondary | ICD-10-CM | POA: Diagnosis not present

## 2020-10-23 DIAGNOSIS — M62830 Muscle spasm of back: Secondary | ICD-10-CM | POA: Diagnosis not present

## 2020-10-23 DIAGNOSIS — M9902 Segmental and somatic dysfunction of thoracic region: Secondary | ICD-10-CM | POA: Diagnosis not present

## 2020-10-23 DIAGNOSIS — M9903 Segmental and somatic dysfunction of lumbar region: Secondary | ICD-10-CM | POA: Diagnosis not present

## 2020-10-24 DIAGNOSIS — F4310 Post-traumatic stress disorder, unspecified: Secondary | ICD-10-CM | POA: Diagnosis not present

## 2020-10-30 DIAGNOSIS — M9902 Segmental and somatic dysfunction of thoracic region: Secondary | ICD-10-CM | POA: Diagnosis not present

## 2020-10-30 DIAGNOSIS — M9901 Segmental and somatic dysfunction of cervical region: Secondary | ICD-10-CM | POA: Diagnosis not present

## 2020-10-30 DIAGNOSIS — M9903 Segmental and somatic dysfunction of lumbar region: Secondary | ICD-10-CM | POA: Diagnosis not present

## 2020-10-30 DIAGNOSIS — M62830 Muscle spasm of back: Secondary | ICD-10-CM | POA: Diagnosis not present

## 2020-10-31 DIAGNOSIS — F4310 Post-traumatic stress disorder, unspecified: Secondary | ICD-10-CM | POA: Diagnosis not present

## 2020-11-02 DIAGNOSIS — M62830 Muscle spasm of back: Secondary | ICD-10-CM | POA: Diagnosis not present

## 2020-11-02 DIAGNOSIS — M9903 Segmental and somatic dysfunction of lumbar region: Secondary | ICD-10-CM | POA: Diagnosis not present

## 2020-11-02 DIAGNOSIS — M9902 Segmental and somatic dysfunction of thoracic region: Secondary | ICD-10-CM | POA: Diagnosis not present

## 2020-11-02 DIAGNOSIS — M9901 Segmental and somatic dysfunction of cervical region: Secondary | ICD-10-CM | POA: Diagnosis not present

## 2020-11-06 DIAGNOSIS — M9901 Segmental and somatic dysfunction of cervical region: Secondary | ICD-10-CM | POA: Diagnosis not present

## 2020-11-06 DIAGNOSIS — M9902 Segmental and somatic dysfunction of thoracic region: Secondary | ICD-10-CM | POA: Diagnosis not present

## 2020-11-06 DIAGNOSIS — M9903 Segmental and somatic dysfunction of lumbar region: Secondary | ICD-10-CM | POA: Diagnosis not present

## 2020-11-06 DIAGNOSIS — M62830 Muscle spasm of back: Secondary | ICD-10-CM | POA: Diagnosis not present

## 2020-11-07 DIAGNOSIS — F4310 Post-traumatic stress disorder, unspecified: Secondary | ICD-10-CM | POA: Diagnosis not present

## 2020-11-09 DIAGNOSIS — M62830 Muscle spasm of back: Secondary | ICD-10-CM | POA: Diagnosis not present

## 2020-11-09 DIAGNOSIS — M9903 Segmental and somatic dysfunction of lumbar region: Secondary | ICD-10-CM | POA: Diagnosis not present

## 2020-11-09 DIAGNOSIS — M9901 Segmental and somatic dysfunction of cervical region: Secondary | ICD-10-CM | POA: Diagnosis not present

## 2020-11-09 DIAGNOSIS — M9902 Segmental and somatic dysfunction of thoracic region: Secondary | ICD-10-CM | POA: Diagnosis not present

## 2020-11-14 DIAGNOSIS — F4310 Post-traumatic stress disorder, unspecified: Secondary | ICD-10-CM | POA: Diagnosis not present

## 2020-11-21 ENCOUNTER — Other Ambulatory Visit: Payer: Self-pay | Admitting: Family

## 2020-11-21 DIAGNOSIS — F4310 Post-traumatic stress disorder, unspecified: Secondary | ICD-10-CM | POA: Diagnosis not present

## 2020-11-21 DIAGNOSIS — M9903 Segmental and somatic dysfunction of lumbar region: Secondary | ICD-10-CM | POA: Diagnosis not present

## 2020-11-21 DIAGNOSIS — M9902 Segmental and somatic dysfunction of thoracic region: Secondary | ICD-10-CM | POA: Diagnosis not present

## 2020-11-21 DIAGNOSIS — M9901 Segmental and somatic dysfunction of cervical region: Secondary | ICD-10-CM | POA: Diagnosis not present

## 2020-11-21 DIAGNOSIS — M62830 Muscle spasm of back: Secondary | ICD-10-CM | POA: Diagnosis not present

## 2020-11-21 DIAGNOSIS — G43909 Migraine, unspecified, not intractable, without status migrainosus: Secondary | ICD-10-CM

## 2020-11-27 ENCOUNTER — Other Ambulatory Visit: Payer: Self-pay | Admitting: Family

## 2020-11-27 DIAGNOSIS — G43909 Migraine, unspecified, not intractable, without status migrainosus: Secondary | ICD-10-CM

## 2020-11-28 DIAGNOSIS — F4310 Post-traumatic stress disorder, unspecified: Secondary | ICD-10-CM | POA: Diagnosis not present

## 2020-11-29 ENCOUNTER — Other Ambulatory Visit: Payer: Self-pay | Admitting: *Deleted

## 2020-11-29 DIAGNOSIS — G43909 Migraine, unspecified, not intractable, without status migrainosus: Secondary | ICD-10-CM

## 2020-11-30 MED ORDER — VENLAFAXINE HCL ER 37.5 MG PO CP24: 37.5000 mg | ORAL_CAPSULE | Freq: Every day | ORAL | 0 refills | Status: DC

## 2020-12-07 ENCOUNTER — Encounter: Payer: Self-pay | Admitting: Family

## 2020-12-07 ENCOUNTER — Other Ambulatory Visit: Payer: Self-pay

## 2020-12-07 ENCOUNTER — Ambulatory Visit (INDEPENDENT_AMBULATORY_CARE_PROVIDER_SITE_OTHER): Payer: Medicaid Other | Admitting: Family

## 2020-12-07 VITALS — BP 102/66 | HR 67 | Temp 97.8°F | Ht 62.0 in | Wt 290.6 lb

## 2020-12-07 DIAGNOSIS — J301 Allergic rhinitis due to pollen: Secondary | ICD-10-CM | POA: Diagnosis not present

## 2020-12-07 DIAGNOSIS — G43909 Migraine, unspecified, not intractable, without status migrainosus: Secondary | ICD-10-CM

## 2020-12-07 DIAGNOSIS — F320 Major depressive disorder, single episode, mild: Secondary | ICD-10-CM | POA: Diagnosis not present

## 2020-12-07 DIAGNOSIS — K219 Gastro-esophageal reflux disease without esophagitis: Secondary | ICD-10-CM | POA: Diagnosis not present

## 2020-12-07 DIAGNOSIS — Z1159 Encounter for screening for other viral diseases: Secondary | ICD-10-CM

## 2020-12-07 DIAGNOSIS — J309 Allergic rhinitis, unspecified: Secondary | ICD-10-CM | POA: Insufficient documentation

## 2020-12-07 MED ORDER — VENLAFAXINE HCL ER 37.5 MG PO CP24: 37.5000 mg | ORAL_CAPSULE | Freq: Every day | ORAL | 0 refills | Status: DC

## 2020-12-07 MED ORDER — FEXOFENADINE HCL 180 MG PO TABS: 180.0000 mg | ORAL_TABLET | Freq: Every day | ORAL | 1 refills | Status: DC

## 2020-12-07 NOTE — Progress Notes (Signed)
Subjective:    Patient ID: Lauren Green, female    DOB: 02-07-1986, 35 y.o.   MRN: [id number]  Chief Complaint  Patient presents with  . Medical Management of Chronic Issues   PT presents to the office today for chronic follow up. She is followed by GYN annually.  Headache  This is a chronic problem. The current episode started more than 1 year ago. The problem has been resolved. The pain is located in the right unilateral region. The pain quality is similar to prior headaches. Associated symptoms include phonophobia and photophobia. Pertinent negatives include no rhinorrhea. The treatment provided moderate relief.  Gastroesophageal Reflux She complains of belching and heartburn. This is a chronic problem. The current episode started more than 1 year ago. The problem occurs occasionally. The problem has been waxing and waning. She has tried a PPI for the symptoms. The treatment provided moderate relief.  Depression        This is a chronic problem.  The current episode started more than 1 year ago.   The onset quality is gradual.   The problem occurs intermittently.  The problem has been waxing and waning since onset.  Associated symptoms include irritable, restlessness and headaches.  Past treatments include SNRIs - Serotonin and norepinephrine reuptake inhibitors. Nicotine Dependence Presents for follow-up visit. Her urge triggers include company of smokers. The symptoms have been stable. She smokes < 1/2 a pack of cigarettes per day.      Review of Systems  HENT: Negative for rhinorrhea.   Eyes: Positive for photophobia.  Gastrointestinal: Positive for heartburn.  Neurological: Positive for headaches.  Psychiatric/Behavioral: Positive for depression.  All other systems reviewed and are negative.      Objective:   Physical Exam Vitals reviewed.  Constitutional:      General: She is irritable. She is not in acute distress.    Appearance: She is well-developed. She is  obese.  HENT:     Head: Normocephalic and atraumatic.     Right Ear: Tympanic membrane normal.     Left Ear: Tympanic membrane normal.  Eyes:     Pupils: Pupils are equal, round, and reactive to light.  Neck:     Thyroid: No thyromegaly.  Cardiovascular:     Rate and Rhythm: Normal rate and regular rhythm.     Heart sounds: Normal heart sounds. No murmur heard.   Pulmonary:     Effort: Pulmonary effort is normal. No respiratory distress.     Breath sounds: Normal breath sounds. No wheezing.  Abdominal:     General: Bowel sounds are normal. There is no distension.     Palpations: Abdomen is soft.     Tenderness: There is no abdominal tenderness.  Musculoskeletal:        General: No tenderness. Normal range of motion.     Cervical back: Normal range of motion and neck supple.  Skin:    General: Skin is warm and dry.  Neurological:     Mental Status: She is alert and oriented to person, place, and time.     Cranial Nerves: No cranial nerve deficit.     Deep Tendon Reflexes: Reflexes are normal and symmetric.  Psychiatric:        Behavior: Behavior normal.        Thought Content: Thought content normal.        Judgment: Judgment normal.       BP 98/65   Pulse 82   Temp 97.8 F (  36.6 C)   Ht $R'5\' 2"'nV$  (1.575 m)   Wt 290 lb 9.6 oz (131.8 kg)   SpO2 97%   BMI 53.15 kg/m      Assessment & Plan:  Lauren Green comes in today with chief complaint of Medical Management of Chronic Issues   Diagnosis and orders addressed:  1. Migraine without status migrainosus, not intractable, unspecified migraine type - venlafaxine XR (EFFEXOR-XR) 37.5 MG 24 hr capsule; Take 1-2 capsules (37.5-75 mg total) by mouth daily with breakfast.  Dispense: 60 capsule; Refill: 0 - fexofenadine (ALLEGRA) 180 MG tablet; Take 1 tablet (180 mg total) by mouth at bedtime.  Dispense: 90 tablet; Refill: 1 - CMP14+EGFR - CBC with Differential/Platelet  2. Depression, major, single episode, mild  (HCC) - CMP14+EGFR - CBC with Differential/Platelet  3. Morbid obesity ([place]) - CMP14+EGFR - CBC with Differential/Platelet  4. Gastroesophageal reflux disease, unspecified whether esophagitis present - CMP14+EGFR - CBC with Differential/Platelet  5. Allergic rhinitis due to pollen, unspecified seasonality - fexofenadine (ALLEGRA) 180 MG tablet; Take 1 tablet (180 mg total) by mouth at bedtime.  Dispense: 90 tablet; Refill: 1 - CMP14+EGFR - CBC with Differential/Platelet  6. Need for hepatitis C screening test  - Hepatitis C antibody - CMP14+EGFR - CBC with Differential/Platelet   Labs pending Health Maintenance reviewed Diet and exercise encouraged  Follow up plan: 6 months    Evelina Dun, FNP

## 2020-12-07 NOTE — Patient Instructions (Signed)

## 2020-12-08 LAB — CMP14+EGFR
ALT: 12 IU/L (ref 0–32)
AST: 15 IU/L (ref 0–40)
Albumin/Globulin Ratio: 1.6 (ref 1.2–2.2)
Albumin: 4 g/dL (ref 3.8–4.8)
Alkaline Phosphatase: 67 IU/L (ref 44–121)
BUN/Creatinine Ratio: 10 (ref 9–23)
BUN: 8 mg/dL (ref 6–20)
Bilirubin Total: <0.2 mg/dL (ref 0.0–1.2)
CO2: 21 mmol/L (ref 20–29)
Calcium: 8.9 mg/dL (ref 8.7–10.2)
Chloride: 106 mmol/L (ref 96–106)
Creatinine, Ser: 0.78 mg/dL (ref 0.57–1.00)
Globulin, Total: 2.5 g/dL (ref 1.5–4.5)
Glucose: 81 mg/dL (ref 65–99)
Potassium: 4.6 mmol/L (ref 3.5–5.2)
Sodium: 141 mmol/L (ref 134–144)
Total Protein: 6.5 g/dL (ref 6.0–8.5)
eGFR: 102 mL/min/{1.73_m2} (ref >59)

## 2020-12-08 LAB — CBC WITH DIFFERENTIAL/PLATELET
Basophils Absolute: 0.1 10*3/uL (ref 0.0–0.2)
Basos: 1 %
EOS (ABSOLUTE): 0.1 10*3/uL (ref 0.0–0.4)
Eos: 2 %
Hematocrit: 38.8 % (ref 34.0–46.6)
Hemoglobin: 12.7 g/dL (ref 11.1–15.9)
Immature Grans (Abs): 0 10*3/uL (ref 0.0–0.1)
Immature Granulocytes: 0 %
Lymphocytes Absolute: 2.4 10*3/uL (ref 0.7–3.1)
Lymphs: 45 %
MCH: 29.3 pg (ref 26.6–33.0)
MCHC: 32.7 g/dL (ref 31.5–35.7)
MCV: 89 fL (ref 79–97)
Monocytes Absolute: 0.5 10*3/uL (ref 0.1–0.9)
Monocytes: 9 %
Neutrophils Absolute: 2.3 10*3/uL (ref 1.4–7.0)
Neutrophils: 43 %
Platelets: 265 10*3/uL (ref 150–450)
RBC: 4.34 x10E6/uL (ref 3.77–5.28)
RDW: 13.4 % (ref 11.7–15.4)
WBC: 5.4 10*3/uL (ref 3.4–10.8)

## 2020-12-08 LAB — HEPATITIS C ANTIBODY: Hep C Virus Ab: <0.1 s/co ratio (ref 0.0–0.9)

## 2020-12-21 DIAGNOSIS — F4310 Post-traumatic stress disorder, unspecified: Secondary | ICD-10-CM | POA: Diagnosis not present

## 2020-12-29 DIAGNOSIS — F4310 Post-traumatic stress disorder, unspecified: Secondary | ICD-10-CM | POA: Diagnosis not present

## 2021-01-10 DIAGNOSIS — F4310 Post-traumatic stress disorder, unspecified: Secondary | ICD-10-CM | POA: Diagnosis not present

## 2021-01-17 DIAGNOSIS — F4310 Post-traumatic stress disorder, unspecified: Secondary | ICD-10-CM | POA: Diagnosis not present

## 2021-01-26 DIAGNOSIS — F4310 Post-traumatic stress disorder, unspecified: Secondary | ICD-10-CM | POA: Diagnosis not present

## 2021-02-02 DIAGNOSIS — F4310 Post-traumatic stress disorder, unspecified: Secondary | ICD-10-CM | POA: Diagnosis not present

## 2021-02-09 DIAGNOSIS — F4310 Post-traumatic stress disorder, unspecified: Secondary | ICD-10-CM | POA: Diagnosis not present

## 2021-02-10 ENCOUNTER — Other Ambulatory Visit: Payer: Self-pay | Admitting: Family

## 2021-02-10 DIAGNOSIS — G43909 Migraine, unspecified, not intractable, without status migrainosus: Secondary | ICD-10-CM

## 2021-02-16 DIAGNOSIS — F4310 Post-traumatic stress disorder, unspecified: Secondary | ICD-10-CM | POA: Diagnosis not present

## 2021-02-23 DIAGNOSIS — F4310 Post-traumatic stress disorder, unspecified: Secondary | ICD-10-CM | POA: Diagnosis not present

## 2021-03-02 DIAGNOSIS — F4310 Post-traumatic stress disorder, unspecified: Secondary | ICD-10-CM | POA: Diagnosis not present

## 2021-03-23 DIAGNOSIS — F4310 Post-traumatic stress disorder, unspecified: Secondary | ICD-10-CM | POA: Diagnosis not present

## 2021-04-06 DIAGNOSIS — F4310 Post-traumatic stress disorder, unspecified: Secondary | ICD-10-CM | POA: Diagnosis not present

## 2021-04-20 DIAGNOSIS — F4310 Post-traumatic stress disorder, unspecified: Secondary | ICD-10-CM | POA: Diagnosis not present

## 2021-05-04 DIAGNOSIS — F4310 Post-traumatic stress disorder, unspecified: Secondary | ICD-10-CM | POA: Diagnosis not present

## 2021-05-18 DIAGNOSIS — F4310 Post-traumatic stress disorder, unspecified: Secondary | ICD-10-CM | POA: Diagnosis not present

## 2021-05-31 DIAGNOSIS — Z113 Encounter for screening for infections with a predominantly sexual mode of transmission: Secondary | ICD-10-CM | POA: Diagnosis not present

## 2021-05-31 DIAGNOSIS — Z0000 Encounter for general adult medical examination without abnormal findings: Secondary | ICD-10-CM | POA: Diagnosis not present

## 2021-06-01 DIAGNOSIS — F4310 Post-traumatic stress disorder, unspecified: Secondary | ICD-10-CM | POA: Diagnosis not present

## 2021-06-15 DIAGNOSIS — F4310 Post-traumatic stress disorder, unspecified: Secondary | ICD-10-CM | POA: Diagnosis not present

## 2021-06-22 ENCOUNTER — Other Ambulatory Visit: Payer: Self-pay | Admitting: Family

## 2021-06-22 DIAGNOSIS — G43909 Migraine, unspecified, not intractable, without status migrainosus: Secondary | ICD-10-CM

## 2021-06-29 DIAGNOSIS — F4310 Post-traumatic stress disorder, unspecified: Secondary | ICD-10-CM | POA: Diagnosis not present

## 2021-08-09 ENCOUNTER — Other Ambulatory Visit: Payer: Self-pay | Admitting: Family

## 2021-08-09 DIAGNOSIS — G43909 Migraine, unspecified, not intractable, without status migrainosus: Secondary | ICD-10-CM

## 2021-08-09 NOTE — Telephone Encounter (Signed)
[employer] NTBS 30 days given 06/22/21

## 2021-08-09 NOTE — Telephone Encounter (Signed)
Made appt for 12/12

## 2021-08-20 ENCOUNTER — Encounter: Payer: Self-pay | Admitting: Family

## 2021-08-20 ENCOUNTER — Ambulatory Visit (INDEPENDENT_AMBULATORY_CARE_PROVIDER_SITE_OTHER): Payer: Medicaid Other | Admitting: Family

## 2021-08-20 VITALS — BP 111/72 | HR 75 | Temp 97.6°F | Ht 62.0 in | Wt 277.4 lb

## 2021-08-20 DIAGNOSIS — Z0000 Encounter for general adult medical examination without abnormal findings: Secondary | ICD-10-CM

## 2021-08-20 DIAGNOSIS — Z23 Encounter for immunization: Secondary | ICD-10-CM

## 2021-08-20 DIAGNOSIS — J0101 Acute recurrent maxillary sinusitis: Secondary | ICD-10-CM | POA: Diagnosis not present

## 2021-08-20 DIAGNOSIS — Z0001 Encounter for general adult medical examination with abnormal findings: Secondary | ICD-10-CM

## 2021-08-20 DIAGNOSIS — G43909 Migraine, unspecified, not intractable, without status migrainosus: Secondary | ICD-10-CM | POA: Diagnosis not present

## 2021-08-20 DIAGNOSIS — J301 Allergic rhinitis due to pollen: Secondary | ICD-10-CM | POA: Diagnosis not present

## 2021-08-20 DIAGNOSIS — F320 Major depressive disorder, single episode, mild: Secondary | ICD-10-CM | POA: Diagnosis not present

## 2021-08-20 DIAGNOSIS — K219 Gastro-esophageal reflux disease without esophagitis: Secondary | ICD-10-CM

## 2021-08-20 MED ORDER — FLUTICASONE PROPIONATE 50 MCG/ACT NA SUSP: 2.0000 | Freq: Every day | NASAL | 6 refills | Status: DC

## 2021-08-20 MED ORDER — VENLAFAXINE HCL ER 37.5 MG PO CP24: 37.5000 mg | ORAL_CAPSULE | Freq: Every day | ORAL | 3 refills | Status: DC

## 2021-08-20 MED ORDER — RIZATRIPTAN BENZOATE 10 MG PO TABS: 10.0000 mg | ORAL_TABLET | ORAL | 1 refills | Status: DC | PRN

## 2021-08-20 MED ORDER — FEXOFENADINE HCL 180 MG PO TABS: 180.0000 mg | ORAL_TABLET | Freq: Every day | ORAL | 1 refills | Status: DC

## 2021-08-20 NOTE — Patient Instructions (Signed)

## 2021-08-20 NOTE — Progress Notes (Signed)
Subjective:    Patient ID: Lauren Green, female    DOB: 1986-04-20, 35 y.o.   MRN: [id number]  Chief Complaint  Patient presents with   Medical Management of Chronic Issues   PT presents to the office today for chronic follow up. She is followed by GYN annually.  Depression        This is a chronic problem.  The current episode started more than 1 year ago.   Associated symptoms include helplessness, hopelessness, irritable, restlessness and sad.  Past treatments include SNRIs - Serotonin and norepinephrine reuptake inhibitors. Gastroesophageal Reflux She complains of belching and heartburn. This is a chronic problem. The current episode started more than 1 year ago. The problem occurs occasionally. The problem has been waxing and waning. Risk factors include obesity. She has tried a PPI for the symptoms. The treatment provided moderate relief.  Migraine  This is a chronic problem. The current episode started more than 1 year ago. The problem occurs intermittently. The problem has been resolved.  Nicotine Dependence Presents for follow-up visit. Her urge triggers include company of smokers. She smokes < 1/2 a pack of cigarettes per day.     Review of Systems  Gastrointestinal:  Positive for heartburn.  Psychiatric/Behavioral:  Positive for depression.   All other systems reviewed and are negative.  Family History  Problem Relation Age of Onset   Multiple sclerosis Mother    Anesthesia problems Neg Hx    Hypotension Neg Hx    Malignant hyperthermia Neg Hx    Pseudochol deficiency Neg Hx    Social History   Socioeconomic History   Marital status: Single    Spouse name: Not on file   Number of children: Not on file   Years of education: Not on file   Highest education level: Not on file  Occupational History   Not on file  Tobacco Use   Smoking status: Every Day    Packs/day: 0.50    Years: 15.00    Pack years: 7.50    Types: Cigarettes   Smokeless tobacco: Never    Tobacco comments:    5-7- cigs per day  Vaping Use   Vaping Use: Never used  Substance and Sexual Activity   Alcohol use: Not Currently   Drug use: Yes    Types: Marijuana    Comment: marijiana occ last used 06-14-2020   Sexual activity: Yes    Birth control/protection: Injection  Other Topics Concern   Not on file  Social History Narrative   Not on file   Social Determinants of Health   Financial Resource Strain: Not on file  Food Insecurity: Not on file  Transportation Needs: Not on file  Physical Activity: Not on file  Stress: Not on file  Social Connections: Not on file       Objective:   Physical Exam Vitals reviewed.  Constitutional:      General: She is irritable. She is not in acute distress.    Appearance: She is well-developed. She is obese.  HENT:     Head: Normocephalic and atraumatic.     Right Ear: Tympanic membrane normal.     Left Ear: Tympanic membrane normal.  Eyes:     Pupils: Pupils are equal, round, and reactive to light.  Neck:     Thyroid: No thyromegaly.  Cardiovascular:     Rate and Rhythm: Normal rate and regular rhythm.     Heart sounds: Normal heart sounds. No murmur heard. Pulmonary:  Effort: Pulmonary effort is normal. No respiratory distress.     Breath sounds: Normal breath sounds. No wheezing.  Abdominal:     General: Bowel sounds are normal. There is no distension.     Palpations: Abdomen is soft.     Tenderness: There is no abdominal tenderness.  Musculoskeletal:        General: No tenderness. Normal range of motion.     Cervical back: Normal range of motion and neck supple.  Skin:    General: Skin is warm and dry.  Neurological:     Mental Status: She is alert and oriented to person, place, and time.     Cranial Nerves: No cranial nerve deficit.     Deep Tendon Reflexes: Reflexes are normal and symmetric.  Psychiatric:        Behavior: Behavior normal.        Thought Content: Thought content normal.         Judgment: Judgment normal.      BP 111/72   Pulse 75   Temp 97.6 F (36.4 C) (Temporal)   Ht $R'5\' 2"'at$  (1.575 m)   Wt 277 lb 6.4 oz (125.8 kg)   BMI 50.74 kg/m      Assessment & Plan:  MADILINE SAFFRAN comes in today with chief complaint of Medical Management of Chronic Issues   Diagnosis and orders addressed:  1. Migraine without status migrainosus, not intractable, unspecified migraine type - fexofenadine (ALLEGRA) 180 MG tablet; Take 1 tablet (180 mg total) by mouth at bedtime.  Dispense: 90 tablet; Refill: 1 - rizatriptan (MAXALT) 10 MG tablet; Take 1 tablet (10 mg total) by mouth as needed for migraine.  Dispense: 90 tablet; Refill: 1 - venlafaxine XR (EFFEXOR-XR) 37.5 MG 24 hr capsule; Take 1-2 capsules (37.5-75 mg total) by mouth daily with breakfast. (NEEDS TO BE SEEN BEFORE NEXT REFILL)  Dispense: 180 capsule; Refill: 3 - CMP14+EGFR - CBC with Differential/Platelet  2. Allergic rhinitis due to pollen, unspecified seasonality - fexofenadine (ALLEGRA) 180 MG tablet; Take 1 tablet (180 mg total) by mouth at bedtime.  Dispense: 90 tablet; Refill: 1 - CMP14+EGFR - CBC with Differential/Platelet  3. Acute recurrent maxillary sinusitis - fluticasone (FLONASE) 50 MCG/ACT nasal spray; Place 2 sprays into both nostrils daily.  Dispense: 16 g; Refill: 6 - CMP14+EGFR - CBC with Differential/Platelet  4. Need for immunization against influenza - Flu Vaccine QUAD 24mo+IM (Fluarix, Fluzone & Alfiuria Quad PF) - CMP14+EGFR - CBC with Differential/Platelet  5. Depression, major, single episode, mild (HCC) - CMP14+EGFR - CBC with Differential/Platelet  6. Gastroesophageal reflux disease, unspecified whether esophagitis present - CMP14+EGFR - CBC with Differential/Platelet  7. Morbid obesity ([place]) - CMP14+EGFR - CBC with Differential/Platelet  8. Annual physical exam - CMP14+EGFR - CBC with Differential/Platelet - Lipid panel - TSH   Labs pending Health Maintenance  reviewed Diet and exercise encouraged  Follow up plan: 6 months   Evelina Dun, FNP

## 2021-08-21 LAB — CBC WITH DIFFERENTIAL/PLATELET
Basophils Absolute: 0 10*3/uL (ref 0.0–0.2)
Basos: 1 %
EOS (ABSOLUTE): 0.1 10*3/uL (ref 0.0–0.4)
Eos: 2 %
Hematocrit: 37.4 % (ref 34.0–46.6)
Hemoglobin: 12.6 g/dL (ref 11.1–15.9)
Immature Grans (Abs): 0 10*3/uL (ref 0.0–0.1)
Immature Granulocytes: 0 %
Lymphocytes Absolute: 2.9 10*3/uL (ref 0.7–3.1)
Lymphs: 45 %
MCH: 30 pg (ref 26.6–33.0)
MCHC: 33.7 g/dL (ref 31.5–35.7)
MCV: 89 fL (ref 79–97)
Monocytes Absolute: 0.6 10*3/uL (ref 0.1–0.9)
Monocytes: 10 %
Neutrophils Absolute: 2.7 10*3/uL (ref 1.4–7.0)
Neutrophils: 42 %
Platelets: 279 10*3/uL (ref 150–450)
RBC: 4.2 x10E6/uL (ref 3.77–5.28)
RDW: 13 % (ref 11.7–15.4)
WBC: 6.4 10*3/uL (ref 3.4–10.8)

## 2021-08-21 LAB — CMP14+EGFR
ALT: 10 IU/L (ref 0–32)
AST: 14 IU/L (ref 0–40)
Albumin/Globulin Ratio: 1.6 (ref 1.2–2.2)
Albumin: 3.9 g/dL (ref 3.8–4.8)
Alkaline Phosphatase: 62 IU/L (ref 44–121)
BUN/Creatinine Ratio: 11 (ref 9–23)
BUN: 10 mg/dL (ref 6–20)
Bilirubin Total: <0.2 mg/dL (ref 0.0–1.2)
CO2: 21 mmol/L (ref 20–29)
Calcium: 8.9 mg/dL (ref 8.7–10.2)
Chloride: 107 mmol/L — ABNORMAL HIGH (ref 96–106)
Creatinine, Ser: 0.89 mg/dL (ref 0.57–1.00)
Globulin, Total: 2.4 g/dL (ref 1.5–4.5)
Glucose: 83 mg/dL (ref 70–99)
Potassium: 4.8 mmol/L (ref 3.5–5.2)
Sodium: 142 mmol/L (ref 134–144)
Total Protein: 6.3 g/dL (ref 6.0–8.5)
eGFR: 86 mL/min/{1.73_m2} (ref >59)

## 2021-08-21 LAB — LIPID PANEL
Chol/HDL Ratio: 3.7 ratio (ref 0.0–4.4)
Cholesterol, Total: 188 mg/dL (ref 100–199)
HDL: 51 mg/dL (ref >39)
LDL Chol Calc (NIH): 122 mg/dL — ABNORMAL HIGH (ref 0–99)
Triglycerides: 84 mg/dL (ref 0–149)
VLDL Cholesterol Cal: 15 mg/dL (ref 5–40)

## 2021-08-21 LAB — TSH: TSH: 0.472 u[IU]/mL (ref 0.450–4.500)

## 2021-08-22 ENCOUNTER — Telehealth: Payer: Self-pay | Admitting: Family Medicine

## 2021-08-22 NOTE — Telephone Encounter (Signed)
Your demographic data has been sent to [facility]

## 2021-08-27 NOTE — Telephone Encounter (Signed)
Zada Girt KeyRosalita Chessman - Rx #: [id number] Need help? Call us at [phone] Outcome Additional Information Required Prior Authorization is not required at this time. Pharmacy needs to submit override codes for Drug Utilization Review. Drug Venlafaxine HCl ER 37.5MG  er capsules Form [facility] Electronic [place] Form [phone] NCPDP)  NO PA needed, faxed this form to [facility], received confirmation.

## 2021-11-12 DIAGNOSIS — F4310 Post-traumatic stress disorder, unspecified: Secondary | ICD-10-CM | POA: Diagnosis not present

## 2021-11-19 DIAGNOSIS — F4310 Post-traumatic stress disorder, unspecified: Secondary | ICD-10-CM | POA: Diagnosis not present

## 2021-11-26 DIAGNOSIS — F4310 Post-traumatic stress disorder, unspecified: Secondary | ICD-10-CM | POA: Diagnosis not present

## 2021-12-02 ENCOUNTER — Other Ambulatory Visit: Payer: Self-pay

## 2021-12-02 ENCOUNTER — Emergency Department (HOSPITAL_COMMUNITY): Payer: Medicaid Other

## 2021-12-02 ENCOUNTER — Encounter (HOSPITAL_COMMUNITY): Payer: Self-pay

## 2021-12-02 ENCOUNTER — Emergency Department (HOSPITAL_COMMUNITY)
Admission: EM | Admit: 2021-12-02 | Discharge: 2021-12-02 | Disposition: A | Discharge status: Home / Self Care | Payer: Medicaid Other | Attending: Emergency Medicine | Admitting: Emergency Medicine

## 2021-12-02 DIAGNOSIS — R112 Nausea with vomiting, unspecified: Secondary | ICD-10-CM | POA: Diagnosis not present

## 2021-12-02 DIAGNOSIS — K449 Diaphragmatic hernia without obstruction or gangrene: Secondary | ICD-10-CM | POA: Diagnosis not present

## 2021-12-02 DIAGNOSIS — R3915 Urgency of urination: Secondary | ICD-10-CM | POA: Insufficient documentation

## 2021-12-02 DIAGNOSIS — R1031 Right lower quadrant pain: Secondary | ICD-10-CM | POA: Diagnosis not present

## 2021-12-02 DIAGNOSIS — R188 Other ascites: Secondary | ICD-10-CM | POA: Diagnosis not present

## 2021-12-02 LAB — URINALYSIS, ROUTINE W REFLEX MICROSCOPIC
Bilirubin Urine: NEGATIVE
Glucose, UA: NEGATIVE mg/dL
Hgb urine dipstick: NEGATIVE
Ketones, ur: 20 mg/dL — AB
Leukocytes,Ua: NEGATIVE
Nitrite: NEGATIVE
Protein, ur: NEGATIVE mg/dL
Specific Gravity, Urine: 1.018 (ref 1.005–1.030)
pH: 7 (ref 5.0–8.0)

## 2021-12-02 LAB — COMPREHENSIVE METABOLIC PANEL
ALT: 17 U/L (ref 0–44)
AST: 19 U/L (ref 15–41)
Albumin: 4 g/dL (ref 3.5–5.0)
Alkaline Phosphatase: 59 U/L (ref 38–126)
Anion gap: 7 (ref 5–15)
BUN: 9 mg/dL (ref 6–20)
CO2: 23 mmol/L (ref 22–32)
Calcium: 8.7 mg/dL — ABNORMAL LOW (ref 8.9–10.3)
Chloride: 104 mmol/L (ref 98–111)
Creatinine, Ser: 0.71 mg/dL (ref 0.44–1.00)
GFR, Estimated: >60 mL/min (ref >60)
Glucose, Bld: 93 mg/dL (ref 70–99)
Potassium: 3.7 mmol/L (ref 3.5–5.1)
Sodium: 134 mmol/L — ABNORMAL LOW (ref 135–145)
Total Bilirubin: 0.5 mg/dL (ref 0.3–1.2)
Total Protein: 7.5 g/dL (ref 6.5–8.1)

## 2021-12-02 LAB — CBC
HCT: 40.6 % (ref 36.0–46.0)
Hemoglobin: 13.5 g/dL (ref 12.0–15.0)
MCH: 30.7 pg (ref 26.0–34.0)
MCHC: 33.3 g/dL (ref 30.0–36.0)
MCV: 92.3 fL (ref 80.0–100.0)
Platelets: 262 10*3/uL (ref 150–400)
RBC: 4.4 MIL/uL (ref 3.87–5.11)
RDW: 13.5 % (ref 11.5–15.5)
WBC: 9.1 10*3/uL (ref 4.0–10.5)
nRBC: 0 % (ref 0.0–0.2)

## 2021-12-02 LAB — LIPASE, BLOOD: Lipase: 56 U/L — ABNORMAL HIGH (ref 11–51)

## 2021-12-02 MED ORDER — IOHEXOL 300 MG/ML  SOLN
100.0000 mL | Freq: Once | INTRAMUSCULAR | Status: AC | PRN
  Administered 2021-12-02: 100 mL via INTRAVENOUS

## 2021-12-02 NOTE — Discharge Instructions (Signed)
Imaging shows you have a ovarian cyst likely the cause of your pain recommend over-the-counter pain medication as needed please follow-up with your OB/GYN in 1 to 2 months for reevaluation. ? ?Come back to the emergency department if you develop chest pain, shortness of breath, severe abdominal pain, uncontrolled nausea, vomiting, diarrhea. ? ?

## 2021-12-02 NOTE — ED Triage Notes (Signed)
Pt c/o abd pain since this morning and vomiting x2.  ?

## 2021-12-02 NOTE — ED Provider Notes (Signed)
?[facility] ?Provider Note ? ? ?CSN: [id number] ?Arrival date & time: 12/02/21  1349 ? ?  ? ?History ? ?Chief Complaint  ?Patient presents with  ? Abdominal Pain  ? ? ?Green Lauren is a 36 y.o. female. ? ?HPI ? ?Patient without significant medical history presents with complaints of lower abdominal tenderness.  Patient has pain came on suddenly, states she feels pain in her lower abdomen which mainly remains in the right flank/right lower quadrant, describes as a cramping-like sensation, she had associated increased urination but denies dysuria hematuria no pelvic pain no vaginal discharge no vaginal bleeding, no history of ovarian torsion or ovarian cyst.  She has no history of kidney stones, she is endorsing nausea vomiting, she has no fevers or chills general body aches.  She has no significant abdominal history, she recently had a tubal ligation which was uncomplicated.  She states that her pain is improving but still has 3 out of 10 pain. ? ?Home Medications ?Prior to Admission medications   ?Medication Sig Start Date End Date Taking? Authorizing Provider  ?calcium carbonate (TUMS - DOSED IN MG ELEMENTAL CALCIUM) 500 MG chewable tablet Chew 500 mg by mouth daily as needed for indigestion or heartburn.     [provider]  ?esomeprazole (NEXIUM) 20 MG capsule Take 20 mg by mouth at bedtime.    [provider]  ?fexofenadine (ALLEGRA) 180 MG tablet Take 1 tablet (180 mg total) by mouth at bedtime. 08/20/21   Junie Spencer, FNP  ?fluticasone (FLONASE) 50 MCG/ACT nasal spray Place 2 sprays into both nostrils daily. 08/20/21   Junie Spencer, FNP  ?ibuprofen (ADVIL) 200 MG tablet Take 400 mg by mouth every 6 (six) hours as needed for mild pain or moderate pain.    [provider]  ?rizatriptan (MAXALT) 10 MG tablet Take 1 tablet (10 mg total) by mouth as needed for migraine. 08/20/21   Junie Spencer, FNP  ?venlafaxine XR (EFFEXOR-XR) 37.5 MG 24 hr capsule  Take 1-2 capsules (37.5-75 mg total) by mouth daily with breakfast. (NEEDS TO BE SEEN BEFORE NEXT REFILL) 08/20/21   Junie Spencer, FNP  ?   ? ?Allergies    ?Penicillins   ? ?Review of Systems   ?Review of Systems  ?Constitutional:  Negative for chills and fever.  ?Respiratory:  Negative for shortness of breath.   ?Cardiovascular:  Negative for chest pain.  ?Gastrointestinal:  Positive for abdominal pain. Negative for constipation, nausea and vomiting.  ?Genitourinary:  Positive for frequency.  ?Neurological:  Negative for headaches.  ? ?Physical Exam ?Updated Vital Signs ?BP 122/78 (BP Location: Right Arm)   Pulse 64   Temp 98.1 ?F (36.7 ?C) (Oral)   Resp 18   Ht 5\' 2"  (1.575 m)   Wt 127.7 kg   SpO2 100%   BMI 51.49 kg/m?  ?Physical Exam ?Vitals and nursing note reviewed.  ?Constitutional:   ?   General: She is not in acute distress. ?   Appearance: She is not ill-appearing.  ?HENT:  ?   Head: Normocephalic and atraumatic.  ?   Nose: No congestion.  ?Eyes:  ?   Conjunctiva/sclera: Conjunctivae normal.  ?Cardiovascular:  ?   Rate and Rhythm: Normal rate and regular rhythm.  ?   Pulses: Normal pulses.  ?   Heart sounds: No murmur heard. ?  No friction rub. No gallop.  ?Pulmonary:  ?   Effort: No respiratory distress.  ?   Breath sounds:  No wheezing, rhonchi or rales.  ?Abdominal:  ?   Palpations: Abdomen is soft.  ?   Tenderness: There is abdominal tenderness. There is no right CVA tenderness or left CVA tenderness.  ?   Comments: Abdomen nondistended normal bowel sounds, dull to percussion, she has slight tenderness to palpation in her right flank and right lower quadrant, there is no guarding or rebound tenderness, peritoneal sign negative Murphy sign McBurney point no CVA tenderness.  ?Musculoskeletal:  ?   Right lower leg: No edema.  ?   Left lower leg: No edema.  ?Skin: ?   General: Skin is warm and dry.  ?Neurological:  ?   Mental Status: She is alert.  ?Psychiatric:     ?   Mood and Affect: Mood  normal.  ? ? ?ED Results / Procedures / Treatments   ?Labs ?(all labs ordered are listed, but only abnormal results are displayed) ?Labs Reviewed  ?LIPASE, BLOOD - Abnormal; Notable for the following components:  ?    Result Value  ? Lipase 56 (*)   ? All other components within normal limits  ?COMPREHENSIVE METABOLIC PANEL - Abnormal; Notable for the following components:  ? Sodium 134 (*)   ? Calcium 8.7 (*)   ? All other components within normal limits  ?URINALYSIS, ROUTINE W REFLEX MICROSCOPIC - Abnormal; Notable for the following components:  ? Ketones, ur 20 (*)   ? All other components within normal limits  ?CBC  ? ? ?EKG ?None ? ?Radiology ?CT ABDOMEN PELVIS W CONTRAST ? ?Result Date: 12/02/2021 ?CLINICAL DATA:  Abdominal pain, vomiting EXAM: CT ABDOMEN AND PELVIS WITH CONTRAST TECHNIQUE: Multidetector CT imaging of the abdomen and pelvis was performed using the standard protocol following bolus administration of intravenous contrast. RADIATION DOSE REDUCTION: This exam was performed according to the departmental dose-optimization program which includes automated exposure control, adjustment of the mA and/or kV according to patient size and/or use of iterative reconstruction technique. CONTRAST:  OMNIPAQUE IOHEXOL 300 MG/ML  SOLN COMPARISON:  None. FINDINGS: Lower chest: Unremarkable. Hepatobiliary: No focal abnormality is seen in the liver. Liver measures 20.1 cm in length. Gallbladder is unremarkable. Pancreas: No focal abnormality is seen. Spleen: Unremarkable. Adrenals/Urinary Tract: Adrenals are not enlarged. There is no hydronephrosis. 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 There are no renal or ureteral stones. Urinary bladder is unremarkable. Stomach/Bowel: Small hiatal hernia is seen. Stomach is not distended. Small bowel loops are essentially unremarkable. Appendix is not dilated. There is no significant wall thickening in colon. There is no pericolic stranding. Vascular/Lymphatic:  Unremarkable. Reproductive: There is 4.9 cm smooth marginated lesion in the right adnexa. Small amount of free fluid is seen in the cul-de-sac. Other: There is no ascites or pneumoperitoneum. There is possible tiny umbilical hernia containing fat. Musculoskeletal: Degenerative changes are noted in the lumbar spine, more severe at L5-S1 level with encroachment of neural foramina. IMPRESSION: There is no evidence of intestinal obstruction or pneumoperitoneum. Appendix is not dilated. There is no hydronephrosis. There is 4.9 cm smooth marginated fluid density lesion in the right adnexa, possibly functional ovarian cyst. Follow-up pelvic sonogram in 1-2 months may be considered to assess resolution. Small amount of free fluid in the pelvis may suggest recent rupture of ovarian cyst or follicle. Enlarged liver. Small hiatal hernia. Lumbar spondylosis, particularly severe at L5-S1 level. Other findings as described in the body of the report. Electronically Signed   By: Ernie Avena M.D.   On: 12/02/2021 17:33   ? ?  Procedures ?Procedures  ? ? ?Medications Ordered in ED ?Medications  ?iohexol (OMNIPAQUE) 300 MG/ML solution 100 mL (100 mLs Intravenous Contrast Given 12/02/21 1705)  ? ? ?ED Course/ Medical Decision Making/ A&P ?  ?                        ?Medical Decision Making ?Amount and/or Complexity of Data Reviewed ?Labs: ordered. ?Radiology: ordered. ? ?Risk ?Prescription drug management. ? ? ?This patient presents to the ED for concern of abdominal pain, this involves an extensive number of treatment options, and is a complaint that carries with it a high risk of complications and morbidity.  The differential diagnosis includes kidney stone, appendicitis, diverticulitis, bowel obstruction ? ? ? ?Additional history obtained: ? ?Additional history obtained from N/A ?External records from outside source obtained and reviewed including N/A ? ? ?Co morbidities that complicate the patient evaluation ? ?N/A ? ?Social  Determinants of Health: ? ?N/A ? ? ? ?Lab Tests: ? ?I Ordered, and personally interpreted labs.  The pertinent results include: CBC unremarkable, CMP shows sodium 134 calcium 8.7 lipase 56 UA unremarkable. ? ? ?[facility]

## 2021-12-03 ENCOUNTER — Telehealth: Payer: Self-pay

## 2021-12-03 DIAGNOSIS — F4310 Post-traumatic stress disorder, unspecified: Secondary | ICD-10-CM | POA: Diagnosis not present

## 2021-12-03 NOTE — Telephone Encounter (Signed)
Transition Care Management Unsuccessful Follow-up Telephone Call ? ?Date of discharge and from where:  12/02/2021-[place]  ? ?Attempts:  1st Attempt ? ?Reason for unsuccessful TCM follow-up call:  Left voice message ? ?  ?

## 2021-12-05 NOTE — Telephone Encounter (Signed)
Transition Care Management Unsuccessful Follow-up Telephone Call ? ?Date of discharge and from where:  12/02/2021-[place]  ? ?Attempts:  2nd Attempt ? ?Reason for unsuccessful TCM follow-up call:  Left voice message ? ? ? ?

## 2021-12-06 NOTE — Telephone Encounter (Signed)
Transition Care Management Unsuccessful Follow-up Telephone Call ? ?Date of discharge and from where:  12/02/2021-[place]  ? ?Attempts:  3rd Attempt ? ?Reason for unsuccessful TCM follow-up call:  Unable to reach patient ? ? ? ?

## 2021-12-10 DIAGNOSIS — Z029 Encounter for administrative examinations, unspecified: Secondary | ICD-10-CM

## 2021-12-10 DIAGNOSIS — F4310 Post-traumatic stress disorder, unspecified: Secondary | ICD-10-CM | POA: Diagnosis not present

## 2021-12-21 DIAGNOSIS — F4310 Post-traumatic stress disorder, unspecified: Secondary | ICD-10-CM | POA: Diagnosis not present

## 2021-12-28 DIAGNOSIS — F4310 Post-traumatic stress disorder, unspecified: Secondary | ICD-10-CM | POA: Diagnosis not present

## 2022-01-04 DIAGNOSIS — F4310 Post-traumatic stress disorder, unspecified: Secondary | ICD-10-CM | POA: Diagnosis not present

## 2022-01-11 DIAGNOSIS — F4310 Post-traumatic stress disorder, unspecified: Secondary | ICD-10-CM | POA: Diagnosis not present

## 2022-01-25 DIAGNOSIS — F4310 Post-traumatic stress disorder, unspecified: Secondary | ICD-10-CM | POA: Diagnosis not present

## 2022-02-01 DIAGNOSIS — F4310 Post-traumatic stress disorder, unspecified: Secondary | ICD-10-CM | POA: Diagnosis not present

## 2022-02-08 DIAGNOSIS — F4310 Post-traumatic stress disorder, unspecified: Secondary | ICD-10-CM | POA: Diagnosis not present

## 2022-02-15 DIAGNOSIS — F4310 Post-traumatic stress disorder, unspecified: Secondary | ICD-10-CM | POA: Diagnosis not present

## 2022-02-22 DIAGNOSIS — F4310 Post-traumatic stress disorder, unspecified: Secondary | ICD-10-CM | POA: Diagnosis not present

## 2022-03-01 DIAGNOSIS — F4310 Post-traumatic stress disorder, unspecified: Secondary | ICD-10-CM | POA: Diagnosis not present

## 2022-03-08 DIAGNOSIS — F4310 Post-traumatic stress disorder, unspecified: Secondary | ICD-10-CM | POA: Diagnosis not present

## 2022-03-22 DIAGNOSIS — F4310 Post-traumatic stress disorder, unspecified: Secondary | ICD-10-CM | POA: Diagnosis not present

## 2022-04-05 DIAGNOSIS — F4310 Post-traumatic stress disorder, unspecified: Secondary | ICD-10-CM | POA: Diagnosis not present

## 2022-04-19 DIAGNOSIS — F4310 Post-traumatic stress disorder, unspecified: Secondary | ICD-10-CM | POA: Diagnosis not present

## 2022-05-03 DIAGNOSIS — F4310 Post-traumatic stress disorder, unspecified: Secondary | ICD-10-CM | POA: Diagnosis not present

## 2022-05-14 DIAGNOSIS — F4310 Post-traumatic stress disorder, unspecified: Secondary | ICD-10-CM | POA: Diagnosis not present

## 2022-05-28 DIAGNOSIS — F4310 Post-traumatic stress disorder, unspecified: Secondary | ICD-10-CM | POA: Diagnosis not present

## 2022-06-07 DIAGNOSIS — Z0000 Encounter for general adult medical examination without abnormal findings: Secondary | ICD-10-CM | POA: Diagnosis not present

## 2022-06-07 DIAGNOSIS — Z113 Encounter for screening for infections with a predominantly sexual mode of transmission: Secondary | ICD-10-CM | POA: Diagnosis not present

## 2022-06-11 DIAGNOSIS — F4310 Post-traumatic stress disorder, unspecified: Secondary | ICD-10-CM | POA: Diagnosis not present

## 2022-07-02 DIAGNOSIS — F4310 Post-traumatic stress disorder, unspecified: Secondary | ICD-10-CM | POA: Diagnosis not present

## 2022-07-06 DIAGNOSIS — Z760 Encounter for issue of repeat prescription: Secondary | ICD-10-CM | POA: Diagnosis not present

## 2022-07-06 DIAGNOSIS — R051 Acute cough: Secondary | ICD-10-CM | POA: Diagnosis not present

## 2022-07-06 DIAGNOSIS — J101 Influenza due to other identified influenza virus with other respiratory manifestations: Secondary | ICD-10-CM | POA: Diagnosis not present

## 2022-07-16 DIAGNOSIS — F4310 Post-traumatic stress disorder, unspecified: Secondary | ICD-10-CM | POA: Diagnosis not present

## 2022-07-30 DIAGNOSIS — F4310 Post-traumatic stress disorder, unspecified: Secondary | ICD-10-CM | POA: Diagnosis not present

## 2022-08-19 DIAGNOSIS — F4310 Post-traumatic stress disorder, unspecified: Secondary | ICD-10-CM | POA: Diagnosis not present

## 2022-08-26 ENCOUNTER — Other Ambulatory Visit: Payer: Self-pay | Admitting: Family

## 2022-08-26 ENCOUNTER — Telehealth: Payer: Self-pay | Admitting: Family

## 2022-08-26 DIAGNOSIS — G43909 Migraine, unspecified, not intractable, without status migrainosus: Secondary | ICD-10-CM

## 2022-08-26 NOTE — Telephone Encounter (Signed)
  Prescription Request  08/26/2022  Is this a "Controlled Substance" medicine? no  Have you seen your PCP in the last 2 weeks? Pt has appt on 12/28  If YES, route message to pool  -  If NO, patient needs to be scheduled for appointment.  What is the name of the medication or equipment? Pt needs her migraine meds  Have you contacted your pharmacy to request a refill? yes   Which pharmacy would you like this sent to? [facility]   Patient notified that their request is being sent to the clinical staff for review and that they should receive a response within 2 business days.

## 2022-08-26 NOTE — Telephone Encounter (Signed)
Pt aware refill sent to pharmacy, refilled via electronic request

## 2022-09-05 ENCOUNTER — Encounter: Payer: Self-pay | Admitting: Family

## 2022-09-05 ENCOUNTER — Ambulatory Visit: Payer: Medicaid Other | Admitting: Family

## 2022-09-06 DIAGNOSIS — F4310 Post-traumatic stress disorder, unspecified: Secondary | ICD-10-CM | POA: Diagnosis not present

## 2022-09-20 DIAGNOSIS — F4310 Post-traumatic stress disorder, unspecified: Secondary | ICD-10-CM | POA: Diagnosis not present

## 2022-09-24 ENCOUNTER — Ambulatory Visit: Payer: Medicaid Other | Admitting: Family

## 2022-09-24 ENCOUNTER — Encounter: Payer: Self-pay | Admitting: Family

## 2022-10-21 ENCOUNTER — Encounter: Payer: Self-pay | Admitting: Family

## 2022-10-21 ENCOUNTER — Ambulatory Visit (INDEPENDENT_AMBULATORY_CARE_PROVIDER_SITE_OTHER): Payer: Medicaid Other | Admitting: Family

## 2022-10-21 VITALS — BP 101/71 | HR 73 | Temp 98.0°F | Ht 62.0 in | Wt 263.8 lb

## 2022-10-21 DIAGNOSIS — F320 Major depressive disorder, single episode, mild: Secondary | ICD-10-CM | POA: Diagnosis not present

## 2022-10-21 DIAGNOSIS — G43909 Migraine, unspecified, not intractable, without status migrainosus: Secondary | ICD-10-CM | POA: Diagnosis not present

## 2022-10-21 DIAGNOSIS — Z6841 Body Mass Index (BMI) 40.0 and over, adult: Secondary | ICD-10-CM

## 2022-10-21 DIAGNOSIS — K219 Gastro-esophageal reflux disease without esophagitis: Secondary | ICD-10-CM

## 2022-10-21 DIAGNOSIS — Z0001 Encounter for general adult medical examination with abnormal findings: Secondary | ICD-10-CM

## 2022-10-21 DIAGNOSIS — J301 Allergic rhinitis due to pollen: Secondary | ICD-10-CM | POA: Diagnosis not present

## 2022-10-21 DIAGNOSIS — Z0000 Encounter for general adult medical examination without abnormal findings: Secondary | ICD-10-CM | POA: Diagnosis not present

## 2022-10-21 LAB — CBC WITH DIFFERENTIAL/PLATELET

## 2022-10-21 MED ORDER — FLUTICASONE PROPIONATE 50 MCG/ACT NA SUSP: 2.0000 | Freq: Every day | NASAL | 6 refills | Status: DC

## 2022-10-21 MED ORDER — VENLAFAXINE HCL ER 37.5 MG PO CP24: 37.5000 mg | ORAL_CAPSULE | Freq: Every day | ORAL | 3 refills | Status: DC

## 2022-10-21 MED ORDER — RIZATRIPTAN BENZOATE 10 MG PO TABS: ORAL_TABLET | ORAL | 2 refills | Status: DC

## 2022-10-21 MED ORDER — FEXOFENADINE HCL 180 MG PO TABS: 180.0000 mg | ORAL_TABLET | Freq: Every day | ORAL | 1 refills | Status: DC

## 2022-10-21 NOTE — Patient Instructions (Signed)

## 2022-10-21 NOTE — Progress Notes (Signed)
Subjective:    Patient ID: Lauren Green, female    DOB: [birth year], 37 y.o.   MRN: [id number]  Chief Complaint  Patient presents with   Medical Management of Chronic Issues   PT presents to the office today for CPE and  chronic follow up. She is followed by GYN annually.   Migraine  This is a chronic problem. The current episode started more than 1 year ago. The problem occurs seasonly. The problem has been waxing and waning. The pain is located in the Right unilateral region. The pain quality is similar to prior headaches. Associated symptoms include nausea, phonophobia, photophobia and vomiting. She has tried triptans for the symptoms. Her past medical history is significant for migraines in the family.  Gastroesophageal Reflux She complains of heartburn and nausea. She reports no belching. This is a chronic problem. The current episode started more than 1 year ago. The problem occurs rarely. She has tried an antacid for the symptoms. The treatment provided moderate relief.  Depression        This is a chronic problem.  The current episode started more than 1 year ago.   The problem occurs intermittently.  Associated symptoms include no helplessness, no hopelessness, no restlessness and not sad.  Past treatments include SNRIs - Serotonin and norepinephrine reuptake inhibitors.  Compliance with treatment is good.     Review of Systems  Eyes:  Positive for photophobia.  Gastrointestinal:  Positive for heartburn, nausea and vomiting.  Psychiatric/Behavioral:  Positive for depression.   All other systems reviewed and are negative.   Family History  Problem Relation Age of Onset   Multiple sclerosis Mother    Anesthesia problems Neg Hx    Hypotension Neg Hx    Malignant hyperthermia Neg Hx    Pseudochol deficiency Neg Hx    Social History   Socioeconomic History   Marital status: Single    Spouse name: Not on file   Number of children: Not on file   Years of education:  Not on file   Highest education level: Not on file  Occupational History   Not on file  Tobacco Use   Smoking status: Every Day    Packs/day: 0.50    Years: 15.00    Total pack years: 7.50    Types: Cigarettes   Smokeless tobacco: Never   Tobacco comments:    5-7- cigs per day  Vaping Use   Vaping Use: Never used  Substance and Sexual Activity   Alcohol use: Yes    Alcohol/week: 2.0 standard drinks of alcohol    Types: 2 Glasses of wine per week    Comment: occassional   Drug use: Yes    Types: Marijuana    Comment: marijiana occ last used 06-14-2020   Sexual activity: Yes    Birth control/protection: Injection  Other Topics Concern   Not on file  Social History Narrative   Not on file   Social Determinants of Health   Financial Resource Strain: Not on file  Food Insecurity: Not on file  Transportation Needs: Not on file  Physical Activity: Not on file  Stress: Not on file  Social Connections: Not on file       Objective:   Physical Exam Vitals reviewed.  Constitutional:      General: She is not in acute distress.    Appearance: She is well-developed. She is obese.  HENT:     Head: Normocephalic and atraumatic.     Right  Ear: Tympanic membrane normal.     Left Ear: Tympanic membrane normal.  Eyes:     Pupils: Pupils are equal, round, and reactive to light.  Neck:     Thyroid: No thyromegaly.  Cardiovascular:     Rate and Rhythm: Normal rate and regular rhythm.     Heart sounds: Normal heart sounds. No murmur heard. Pulmonary:     Effort: Pulmonary effort is normal. No respiratory distress.     Breath sounds: Normal breath sounds. No wheezing.  Abdominal:     General: Bowel sounds are normal. There is no distension.     Palpations: Abdomen is soft.     Tenderness: There is no abdominal tenderness.  Musculoskeletal:        General: No tenderness. Normal range of motion.     Cervical back: Normal range of motion and neck supple.  Skin:    General: Skin  is warm and dry.  Neurological:     Mental Status: She is alert and oriented to person, place, and time.     Cranial Nerves: No cranial nerve deficit.     Deep Tendon Reflexes: Reflexes are normal and symmetric.  Psychiatric:        Behavior: Behavior normal.        Thought Content: Thought content normal.        Judgment: Judgment normal.       BP 101/71   Pulse 73   Temp 98 F (36.7 C) (Temporal)   Ht 5' 2"$  (1.575 m)   Wt 263 lb 12.8 oz (119.7 kg)   SpO2 99%   BMI 48.25 kg/m      Assessment & Plan:   Lauren Green comes in today with chief complaint of Medical Management of Chronic Issues   Diagnosis and orders addressed:  1. Migraine without status migrainosus, not intractable, unspecified migraine type - fexofenadine (ALLEGRA) 180 MG tablet; Take 1 tablet (180 mg total) by mouth at bedtime.  Dispense: 90 tablet; Refill: 1 - rizatriptan (MAXALT) 10 MG tablet; TAKE 1 TABLET BY MOUTH AS NEEDED FOR MIGRAINE HEADACHE  Dispense: 12 tablet; Refill: 2 - venlafaxine XR (EFFEXOR-XR) 37.5 MG 24 hr capsule; Take 1-2 capsules (37.5-75 mg total) by mouth daily with breakfast. (NEEDS TO BE SEEN BEFORE NEXT REFILL)  Dispense: 180 capsule; Refill: 3 - CMP14+EGFR - CBC with Differential/Platelet  2. Allergic rhinitis due to pollen, unspecified seasonality - fexofenadine (ALLEGRA) 180 MG tablet; Take 1 tablet (180 mg total) by mouth at bedtime.  Dispense: 90 tablet; Refill: 1 - fluticasone (FLONASE) 50 MCG/ACT nasal spray; Place 2 sprays into both nostrils daily.  Dispense: 16 g; Refill: 6 - CMP14+EGFR - CBC with Differential/Platelet  3. Morbid obesity ([place]) - CMP14+EGFR - CBC with Differential/Platelet  4. Gastroesophageal reflux disease, unspecified whether esophagitis present - CMP14+EGFR - CBC with Differential/Platelet  5. Depression, major, single episode, mild (HCC) - CMP14+EGFR - CBC with Differential/Platelet  6. Annual physical exam - CMP14+EGFR - CBC with  Differential/Platelet - Lipid panel - TSH   Labs pending Health Maintenance reviewed Diet and exercise encouraged  Follow up plan: 1 year    Evelina Dun, FNP

## 2022-10-22 LAB — LIPID PANEL
Chol/HDL Ratio: 2.9 ratio (ref 0.0–4.4)
Cholesterol, Total: 166 mg/dL (ref 100–199)
HDL: 57 mg/dL (ref >39)
LDL Chol Calc (NIH): 95 mg/dL (ref 0–99)
Triglycerides: 72 mg/dL (ref 0–149)
VLDL Cholesterol Cal: 14 mg/dL (ref 5–40)

## 2022-10-22 LAB — CMP14+EGFR
ALT: 10 IU/L (ref 0–32)
AST: 15 IU/L (ref 0–40)
Albumin/Globulin Ratio: 1.7 (ref 1.2–2.2)
Albumin: 4.1 g/dL (ref 3.9–4.9)
Alkaline Phosphatase: 59 IU/L (ref 44–121)
BUN/Creatinine Ratio: 12 (ref 9–23)
BUN: 10 mg/dL (ref 6–20)
Bilirubin Total: <0.2 mg/dL (ref 0.0–1.2)
CO2: 21 mmol/L (ref 20–29)
Calcium: 9 mg/dL (ref 8.7–10.2)
Chloride: 105 mmol/L (ref 96–106)
Creatinine, Ser: 0.81 mg/dL (ref 0.57–1.00)
Globulin, Total: 2.4 g/dL (ref 1.5–4.5)
Glucose: 95 mg/dL (ref 70–99)
Potassium: 4.2 mmol/L (ref 3.5–5.2)
Sodium: 138 mmol/L (ref 134–144)
Total Protein: 6.5 g/dL (ref 6.0–8.5)
eGFR: 96 mL/min/{1.73_m2} (ref >59)

## 2022-10-22 LAB — CBC WITH DIFFERENTIAL/PLATELET
Basophils Absolute: 0.1 10*3/uL (ref 0.0–0.2)
Basos: 1 %
EOS (ABSOLUTE): 0.1 10*3/uL (ref 0.0–0.4)
Eos: 2 %
Hematocrit: 37.2 % (ref 34.0–46.6)
Hemoglobin: 12.1 g/dL (ref 11.1–15.9)
Immature Grans (Abs): 0 10*3/uL (ref 0.0–0.1)
Immature Granulocytes: 0 %
Lymphocytes Absolute: 2.4 10*3/uL (ref 0.7–3.1)
Lymphs: 40 %
MCH: 29.1 pg (ref 26.6–33.0)
MCHC: 32.5 g/dL (ref 31.5–35.7)
MCV: 89 fL (ref 79–97)
Monocytes Absolute: 0.5 10*3/uL (ref 0.1–0.9)
Monocytes: 8 %
Neutrophils Absolute: 2.9 10*3/uL (ref 1.4–7.0)
Neutrophils: 49 %
Platelets: 205 10*3/uL (ref 150–450)
RBC: 4.16 x10E6/uL (ref 3.77–5.28)
RDW: 13.1 % (ref 11.7–15.4)
WBC: 5.9 10*3/uL (ref 3.4–10.8)

## 2022-10-22 LAB — TSH: TSH: 0.452 u[IU]/mL (ref 0.450–4.500)

## 2022-11-13 DIAGNOSIS — N926 Irregular menstruation, unspecified: Secondary | ICD-10-CM | POA: Diagnosis not present

## 2023-04-02 DIAGNOSIS — M549 Dorsalgia, unspecified: Secondary | ICD-10-CM | POA: Diagnosis not present

## 2023-06-11 DIAGNOSIS — Z113 Encounter for screening for infections with a predominantly sexual mode of transmission: Secondary | ICD-10-CM | POA: Diagnosis not present

## 2023-06-11 DIAGNOSIS — Z0000 Encounter for general adult medical examination without abnormal findings: Secondary | ICD-10-CM | POA: Diagnosis not present

## 2023-12-11 ENCOUNTER — Ambulatory Visit (INDEPENDENT_AMBULATORY_CARE_PROVIDER_SITE_OTHER): Admitting: Family

## 2023-12-11 ENCOUNTER — Other Ambulatory Visit: Payer: Self-pay | Admitting: Family

## 2023-12-11 ENCOUNTER — Encounter: Payer: Self-pay | Admitting: Family

## 2023-12-11 VITALS — BP 97/64 | HR 64 | Temp 97.6°F | Ht 62.0 in | Wt 274.0 lb

## 2023-12-11 DIAGNOSIS — F320 Major depressive disorder, single episode, mild: Secondary | ICD-10-CM

## 2023-12-11 DIAGNOSIS — Z6841 Body Mass Index (BMI) 40.0 and over, adult: Secondary | ICD-10-CM

## 2023-12-11 DIAGNOSIS — J301 Allergic rhinitis due to pollen: Secondary | ICD-10-CM

## 2023-12-11 DIAGNOSIS — Z0001 Encounter for general adult medical examination with abnormal findings: Secondary | ICD-10-CM

## 2023-12-11 DIAGNOSIS — K219 Gastro-esophageal reflux disease without esophagitis: Secondary | ICD-10-CM | POA: Diagnosis not present

## 2023-12-11 DIAGNOSIS — G43909 Migraine, unspecified, not intractable, without status migrainosus: Secondary | ICD-10-CM

## 2023-12-11 DIAGNOSIS — Z0000 Encounter for general adult medical examination without abnormal findings: Secondary | ICD-10-CM

## 2023-12-11 DIAGNOSIS — Z23 Encounter for immunization: Secondary | ICD-10-CM

## 2023-12-11 MED ORDER — RIZATRIPTAN BENZOATE 10 MG PO TABS: ORAL_TABLET | ORAL | 2 refills | Status: AC

## 2023-12-11 MED ORDER — FEXOFENADINE HCL 180 MG PO TABS: 180.0000 mg | ORAL_TABLET | Freq: Every day | ORAL | 4 refills | Status: AC

## 2023-12-11 MED ORDER — VENLAFAXINE HCL ER 75 MG PO CP24: 75.0000 mg | ORAL_CAPSULE | Freq: Every day | ORAL | 4 refills | Status: AC

## 2023-12-11 NOTE — Progress Notes (Signed)
 Subjective:    Patient ID: Lauren Green, female    DOB: 03-26-1986, 38 y.o.   MRN: [id number]  Chief Complaint  Patient presents with   Annual Exam   PT presents to the office today for CPE and  chronic follow up. She is followed by GYN annually.   Migraine  This is a chronic problem. The current episode started more than 1 year ago. The problem occurs seasonly. The problem has been waxing and waning. The pain is located in the Right unilateral region. The pain quality is similar to prior headaches. Associated symptoms include nausea, phonophobia, photophobia and vomiting. She has tried triptans for the symptoms. The treatment provided significant relief. Her past medical history is significant for migraines in the family.  Gastroesophageal Reflux She complains of heartburn and nausea. She reports no belching. This is a chronic problem. The current episode started more than 1 year ago. The problem occurs rarely. The symptoms are aggravated by certain foods. Risk factors include obesity. She has tried an antacid and a diet change for the symptoms. The treatment provided moderate relief.  Depression        This is a chronic problem.  The current episode started more than 1 year ago.   The onset quality is gradual.   The problem occurs intermittently.  Associated symptoms include no helplessness, no hopelessness, no restlessness and not sad.  Past treatments include SNRIs - Serotonin and norepinephrine reuptake inhibitors.  Compliance with treatment is good. Nicotine Dependence Presents for follow-up visit. Her urge triggers include company of smokers. The symptoms have been stable. She smokes < 1/2 a pack of cigarettes per day.      Review of Systems  Eyes:  Positive for photophobia.  Gastrointestinal:  Positive for heartburn, nausea and vomiting.  All other systems reviewed and are negative.   Family History  Problem Relation Age of Onset   Multiple sclerosis Mother    Anesthesia  problems Neg Hx    Hypotension Neg Hx    Malignant hyperthermia Neg Hx    Pseudochol deficiency Neg Hx    Social History   Socioeconomic History   Marital status: Single    Spouse name: Not on file   Number of children: Not on file   Years of education: Not on file   Highest education level: Not on file  Occupational History   Not on file  Tobacco Use   Smoking status: Every Day    Current packs/day: 0.50    Average packs/day: 0.5 packs/day for 15.0 years (7.5 ttl pk-yrs)    Types: Cigarettes   Smokeless tobacco: Never   Tobacco comments:    5-7- cigs per day  Vaping Use   Vaping status: Never Used  Substance and Sexual Activity   Alcohol use: Yes    Alcohol/week: 2.0 standard drinks of alcohol    Types: 2 Glasses of wine per week    Comment: occassional   Drug use: Yes    Types: Marijuana    Comment: marijiana occ last used 06-14-2020   Sexual activity: Yes    Birth control/protection: Injection  Other Topics Concern   Not on file  Social History Narrative   Not on file   Social Drivers of Health   Financial Resource Strain: Not on file  Food Insecurity: Not on file  Transportation Needs: Not on file  Physical Activity: Not on file  Stress: Not on file  Social Connections: Not on file  Objective:   Physical Exam Vitals reviewed.  Constitutional:      General: She is not in acute distress.    Appearance: She is well-developed. She is obese.  HENT:     Head: Normocephalic and atraumatic.     Right Ear: Tympanic membrane normal.     Left Ear: Tympanic membrane normal.  Eyes:     Pupils: Pupils are equal, round, and reactive to light.  Neck:     Thyroid: No thyromegaly.  Cardiovascular:     Rate and Rhythm: Normal rate and regular rhythm.     Heart sounds: Normal heart sounds. No murmur heard. Pulmonary:     Effort: Pulmonary effort is normal. No respiratory distress.     Breath sounds: Normal breath sounds. No wheezing.  Abdominal:      General: Bowel sounds are normal. There is no distension.     Palpations: Abdomen is soft.     Tenderness: There is no abdominal tenderness.  Musculoskeletal:        General: No tenderness. Normal range of motion.     Cervical back: Normal range of motion and neck supple.  Skin:    General: Skin is warm and dry.  Neurological:     Mental Status: She is alert and oriented to person, place, and time.     Cranial Nerves: No cranial nerve deficit.     Deep Tendon Reflexes: Reflexes are normal and symmetric.  Psychiatric:        Behavior: Behavior normal.        Thought Content: Thought content normal.        Judgment: Judgment normal.       BP 97/64   Pulse 64   Temp 97.6 F (36.4 C) (Temporal)   Ht 5\' 2"  (1.575 m)   Wt 274 lb (124.3 kg)   BMI 50.12 kg/m      Assessment & Plan:   Lauren Green comes in today with chief complaint of Annual Exam   Diagnosis and orders addressed:  1. Migraine without status migrainosus, not intractable, unspecified migraine type - fexofenadine (ALLEGRA) 180 MG tablet; Take 1 tablet (180 mg total) by mouth at bedtime.  Dispense: 90 tablet; Refill: 4 - rizatriptan (MAXALT) 10 MG tablet; TAKE 1 TABLET BY MOUTH AS NEEDED FOR MIGRAINE HEADACHE  Dispense: 12 tablet; Refill: 2 - CMP14+EGFR - CBC with Differential/Platelet  2. Allergic rhinitis due to pollen, unspecified seasonality - fexofenadine (ALLEGRA) 180 MG tablet; Take 1 tablet (180 mg total) by mouth at bedtime.  Dispense: 90 tablet; Refill: 4 - CMP14+EGFR - CBC with Differential/Platelet  3. Annual physical exam (Primary) - CMP14+EGFR - CBC with Differential/Platelet - Lipid panel  4. Depression, major, single episode, mild (HCC) - CMP14+EGFR - CBC with Differential/Platelet - venlafaxine XR (EFFEXOR XR) 75 MG 24 hr capsule; Take 1 capsule (75 mg total) by mouth daily with breakfast.  Dispense: 90 capsule; Refill: 4  5. Gastroesophageal reflux disease, unspecified whether  esophagitis present - CMP14+EGFR - CBC with Differential/Platelet  6. Morbid obesity (HCC) - CMP14+EGFR - CBC with Differential/Platelet    Labs pending Continue current medications  Health Maintenance reviewed Diet and exercise encouraged  Follow up plan: 1 year    Jannifer Rodney, FNP

## 2023-12-11 NOTE — Patient Instructions (Signed)

## 2023-12-12 LAB — CMP14+EGFR
ALT: 12 IU/L (ref 0–32)
AST: 16 IU/L (ref 0–40)
Albumin: 4.1 g/dL (ref 3.9–4.9)
Alkaline Phosphatase: 67 IU/L (ref 44–121)
BUN/Creatinine Ratio: 11 (ref 9–23)
BUN: 9 mg/dL (ref 6–20)
Bilirubin Total: 0.2 mg/dL (ref 0.0–1.2)
CO2: 22 mmol/L (ref 20–29)
Calcium: 9.3 mg/dL (ref 8.7–10.2)
Chloride: 103 mmol/L (ref 96–106)
Creatinine, Ser: 0.8 mg/dL (ref 0.57–1.00)
Globulin, Total: 2.7 g/dL (ref 1.5–4.5)
Glucose: 80 mg/dL (ref 70–99)
Potassium: 4.5 mmol/L (ref 3.5–5.2)
Sodium: 138 mmol/L (ref 134–144)
Total Protein: 6.8 g/dL (ref 6.0–8.5)
eGFR: 97 mL/min/{1.73_m2} (ref >59)

## 2023-12-12 LAB — CBC WITH DIFFERENTIAL/PLATELET
Basophils Absolute: 0.1 10*3/uL (ref 0.0–0.2)
Basos: 1 %
EOS (ABSOLUTE): 0.1 10*3/uL (ref 0.0–0.4)
Eos: 2 %
Hematocrit: 37.5 % (ref 34.0–46.6)
Hemoglobin: 12.1 g/dL (ref 11.1–15.9)
Immature Grans (Abs): 0 10*3/uL (ref 0.0–0.1)
Immature Granulocytes: 0 %
Lymphocytes Absolute: 2.4 10*3/uL (ref 0.7–3.1)
Lymphs: 43 %
MCH: 29.4 pg (ref 26.6–33.0)
MCHC: 32.3 g/dL (ref 31.5–35.7)
MCV: 91 fL (ref 79–97)
Monocytes Absolute: 0.6 10*3/uL (ref 0.1–0.9)
Monocytes: 11 %
Neutrophils Absolute: 2.4 10*3/uL (ref 1.4–7.0)
Neutrophils: 43 %
Platelets: 294 10*3/uL (ref 150–450)
RBC: 4.12 x10E6/uL (ref 3.77–5.28)
RDW: 12.8 % (ref 11.7–15.4)
WBC: 5.5 10*3/uL (ref 3.4–10.8)

## 2023-12-12 LAB — LIPID PANEL
Chol/HDL Ratio: 3 ratio (ref 0.0–4.4)
Cholesterol, Total: 169 mg/dL (ref 100–199)
HDL: 57 mg/dL (ref >39)
LDL Chol Calc (NIH): 102 mg/dL — ABNORMAL HIGH (ref 0–99)
Triglycerides: 46 mg/dL (ref 0–149)
VLDL Cholesterol Cal: 10 mg/dL (ref 5–40)

## 2024-03-10 DIAGNOSIS — R102 Pelvic and perineal pain: Secondary | ICD-10-CM | POA: Diagnosis not present

## 2024-06-29 DIAGNOSIS — Z01419 Encounter for gynecological examination (general) (routine) without abnormal findings: Secondary | ICD-10-CM | POA: Diagnosis not present

## 2024-07-22 ENCOUNTER — Ambulatory Visit: Admitting: Student in an Organized Health Care Education/Training Program
